# Patient Record
Sex: Male | Born: 1970 | Race: White | Hispanic: No | Smoking: Never smoker
Health system: Southern US, Community
[De-identification: ages and names within clinical notes are randomized; demographics above are authoritative.]

## PROBLEM LIST (undated history)

## (undated) ENCOUNTER — Emergency Department: Payer: Self-pay

## (undated) DIAGNOSIS — C9210 Chronic myeloid leukemia, BCR/ABL-positive, not having achieved remission: Secondary | ICD-10-CM

## (undated) DIAGNOSIS — D689 Coagulation defect, unspecified: Secondary | ICD-10-CM

## (undated) DIAGNOSIS — G8929 Other chronic pain: Secondary | ICD-10-CM

## (undated) DIAGNOSIS — C801 Malignant (primary) neoplasm, unspecified: Secondary | ICD-10-CM

## (undated) DIAGNOSIS — K7460 Unspecified cirrhosis of liver: Secondary | ICD-10-CM

## (undated) DIAGNOSIS — G9520 Unspecified cord compression: Secondary | ICD-10-CM

## (undated) DIAGNOSIS — E119 Type 2 diabetes mellitus without complications: Secondary | ICD-10-CM

## (undated) DIAGNOSIS — F1410 Cocaine abuse, uncomplicated: Secondary | ICD-10-CM

## (undated) DIAGNOSIS — I639 Cerebral infarction, unspecified: Secondary | ICD-10-CM

## (undated) DIAGNOSIS — T4145XA Adverse effect of unspecified anesthetic, initial encounter: Secondary | ICD-10-CM

## (undated) DIAGNOSIS — T8859XA Other complications of anesthesia, initial encounter: Secondary | ICD-10-CM

## (undated) HISTORY — PX: TONSILLECTOMY: SUR1361

## (undated) HISTORY — PX: CHOLECYSTECTOMY: SHX55

## (undated) HISTORY — PX: VASECTOMY: SHX75

---

## 2005-10-12 ENCOUNTER — Ambulatory Visit (HOSPITAL_COMMUNITY): Admission: RE | Admit: 2005-10-12 | Discharge: 2005-10-12 | Payer: Self-pay | Admitting: Neurosurgery

## 2005-10-18 ENCOUNTER — Emergency Department (HOSPITAL_COMMUNITY): Admission: EM | Admit: 2005-10-18 | Discharge: 2005-10-18 | Payer: Self-pay | Admitting: Emergency Medicine

## 2005-11-06 ENCOUNTER — Emergency Department (HOSPITAL_COMMUNITY): Admission: EM | Admit: 2005-11-06 | Discharge: 2005-11-07 | Payer: Self-pay | Admitting: Emergency Medicine

## 2005-12-07 ENCOUNTER — Emergency Department (HOSPITAL_COMMUNITY): Admission: EM | Admit: 2005-12-07 | Discharge: 2005-12-08 | Payer: Self-pay | Admitting: Emergency Medicine

## 2006-08-29 ENCOUNTER — Emergency Department (HOSPITAL_COMMUNITY): Admission: EM | Admit: 2006-08-29 | Discharge: 2006-08-29 | Payer: Self-pay | Admitting: Emergency Medicine

## 2007-08-13 ENCOUNTER — Emergency Department (HOSPITAL_COMMUNITY): Admission: EM | Admit: 2007-08-13 | Discharge: 2007-08-13 | Payer: Self-pay | Admitting: Emergency Medicine
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
## 2013-11-18 ENCOUNTER — Encounter (HOSPITAL_COMMUNITY): Payer: Self-pay | Admitting: Emergency Medicine

## 2013-11-18 ENCOUNTER — Emergency Department (HOSPITAL_COMMUNITY): Payer: Self-pay

## 2013-11-18 ENCOUNTER — Emergency Department (HOSPITAL_COMMUNITY)
Admission: EM | Admit: 2013-11-18 | Discharge: 2013-11-18 | Disposition: A | Discharge status: Home / Self Care | Payer: Self-pay | Attending: Emergency Medicine | Admitting: Emergency Medicine

## 2013-11-18 DIAGNOSIS — Z8739 Personal history of other diseases of the musculoskeletal system and connective tissue: Secondary | ICD-10-CM | POA: Insufficient documentation

## 2013-11-18 DIAGNOSIS — H538 Other visual disturbances: Secondary | ICD-10-CM | POA: Insufficient documentation

## 2013-11-18 DIAGNOSIS — R51 Headache: Secondary | ICD-10-CM | POA: Insufficient documentation

## 2013-11-18 DIAGNOSIS — Z8673 Personal history of transient ischemic attack (TIA), and cerebral infarction without residual deficits: Secondary | ICD-10-CM | POA: Insufficient documentation

## 2013-11-18 DIAGNOSIS — R209 Unspecified disturbances of skin sensation: Secondary | ICD-10-CM | POA: Insufficient documentation

## 2013-11-18 DIAGNOSIS — R202 Paresthesia of skin: Secondary | ICD-10-CM

## 2013-11-18 DIAGNOSIS — Z79899 Other long term (current) drug therapy: Secondary | ICD-10-CM | POA: Insufficient documentation

## 2013-11-18 HISTORY — DX: Unspecified cord compression: G95.20

## 2013-11-18 HISTORY — DX: Cerebral infarction, unspecified: I63.9

## 2013-11-18 LAB — CBC WITH DIFFERENTIAL/PLATELET
Basophils Absolute: 0.1 10*3/uL (ref 0.0–0.1)
Basophils Relative: 1 % (ref 0–1)
Hemoglobin: 14.8 g/dL (ref 13.0–17.0)
Lymphocytes Relative: 32 % (ref 12–46)
MCH: 31.1 pg (ref 26.0–34.0)
MCHC: 34.5 g/dL (ref 30.0–36.0)
MCV: 90.1 fL (ref 78.0–100.0)
Monocytes Absolute: 1 10*3/uL (ref 0.1–1.0)
Neutro Abs: 6 10*3/uL (ref 1.7–7.7)
Neutrophils Relative %: 56 % (ref 43–77)
Platelets: 242 10*3/uL (ref 150–400)
RDW: 12.9 % (ref 11.5–15.5)
WBC: 10.6 10*3/uL — ABNORMAL HIGH (ref 4.0–10.5)

## 2013-11-18 LAB — RAPID URINE DRUG SCREEN, HOSP PERFORMED
Amphetamines: NOT DETECTED
Barbiturates: NOT DETECTED
Benzodiazepines: POSITIVE — AB
Cocaine: NOT DETECTED
Opiates: POSITIVE — AB

## 2013-11-18 LAB — POCT I-STAT, CHEM 8
BUN: 7 mg/dL (ref 6–23)
Calcium, Ion: 1.26 mmol/L — ABNORMAL HIGH (ref 1.12–1.23)
Chloride: 102 mEq/L (ref 96–112)
Creatinine, Ser: 1.1 mg/dL (ref 0.50–1.35)
Glucose, Bld: 99 mg/dL (ref 70–99)
HCT: 46 % (ref 39.0–52.0)
Hemoglobin: 15.6 g/dL (ref 13.0–17.0)
TCO2: 28 mmol/L (ref 0–100)

## 2013-11-18 LAB — PROTIME-INR
INR: 1.14 (ref 0.00–1.49)
Prothrombin Time: 14.4 seconds (ref 11.6–15.2)

## 2013-11-18 MED ORDER — DIPHENHYDRAMINE HCL 50 MG/ML IJ SOLN
25.0000 mg | Freq: Once | INTRAMUSCULAR | Status: AC
  Administered 2013-11-18: 25 mg via INTRAVENOUS
  Filled 2013-11-18: qty 1

## 2013-11-18 MED ORDER — LORAZEPAM 2 MG/ML IJ SOLN
1.0000 mg | Freq: Once | INTRAMUSCULAR | Status: AC
  Administered 2013-11-18: 1 mg via INTRAVENOUS
  Filled 2013-11-18: qty 1

## 2013-11-18 MED ORDER — METOCLOPRAMIDE HCL 5 MG/ML IJ SOLN
10.0000 mg | Freq: Once | INTRAMUSCULAR | Status: AC
  Administered 2013-11-18: 10 mg via INTRAVENOUS
  Filled 2013-11-18: qty 2

## 2013-11-18 MED ORDER — LORAZEPAM 2 MG/ML IJ SOLN
INTRAMUSCULAR | Status: AC
  Filled 2013-11-18: qty 1

## 2013-11-18 MED ORDER — KETOROLAC TROMETHAMINE 30 MG/ML IJ SOLN
30.0000 mg | Freq: Once | INTRAMUSCULAR | Status: AC
  Administered 2013-11-18: 30 mg via INTRAVENOUS
  Filled 2013-11-18: qty 1

## 2013-11-18 MED ORDER — OXYCODONE HCL 5 MG PO TABS
30.0000 mg | ORAL_TABLET | Freq: Once | ORAL | Status: AC
  Administered 2013-11-18: 30 mg via ORAL
  Filled 2013-11-18: qty 6

## 2013-11-18 MED ORDER — DEXAMETHASONE SODIUM PHOSPHATE 10 MG/ML IJ SOLN
10.0000 mg | Freq: Once | INTRAMUSCULAR | Status: AC
  Administered 2013-11-18: 10 mg via INTRAVENOUS
  Filled 2013-11-18: qty 1

## 2013-11-18 NOTE — ED Provider Notes (Signed)
Medical screening examination/treatment/procedure(s) were conducted as a shared visit with non-physician practitioner(s) and myself.  I personally evaluated the patient during the encounter.  Pt with onset of right sided headache tonight around midnight with left upper and lower weakness.  Pt reports he had similar headache prior to previous strokes.  Initial stroke 10/2005, admitted to [facility].  Second stroke 05/2007 in [place].  No TPA.  Some cognitive problems post strokes.  Pt takes aspirin.  No current neurologist.  Pt on chronic pain medications for neck pain.  Olivia Mackie, MD 11/18/13 [phone]

## 2013-11-18 NOTE — ED Notes (Signed)
Patient transported to MRI 

## 2013-11-18 NOTE — ED Provider Notes (Signed)
Medical screening examination/treatment/procedure(s) were conducted as a shared visit with non-physician practitioner(s) and myself.  I personally evaluated the patient during the encounter.  EKG Interpretation   None        Olivia Mackie, MD 11/18/13 [phone]

## 2013-11-18 NOTE — ED Notes (Signed)
Pt unable to complete MRI due to anxiety. Pt received 2 mg total of ativan.
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
## 2013-11-18 NOTE — ED Provider Notes (Signed)
6:16 AM Patient signed out to me by Ivonne Andrew, PA-C. Patient is pending CT head and labs for headache work up. Patient was given a migraine cocktail for symptoms.   10:12 AM Patient's head CT unremarkable for acute changes. Patient reports still having a slight headache, however, it is improving. Patient was unable to tolerate the MRI. Patient was given 2mg  IV Ativan for the MRI and was still unable to tolerate the MRI. Patient also requested 30mg  Oxycodone IR, per his usual pain medications that he takes TID.   I reviewed the patient's records from 2006 [facility] from his previous MRI and MRA which showed no infarct or abnormality. Patient stated that he had a previous stroke, however, the records and previous MRI do not show any infarct when he was having the same symptoms in the past.  I feel comfortable that the patient is having intermittent left side neurologic symptoms without infarct as stated in the past. Patient will be discharged with a resource guide and instructed to follow up with a PCP. Vitals stable and patient afebrile.   Emilia Beck, PA-C 11/18/13 1018

## 2013-11-18 NOTE — ED Notes (Signed)
Per EMS, pt has HA that started behind right eye around midnight. Hx of stroke in 2006 and 2008.

## 2013-11-18 NOTE — ED Notes (Signed)
PA at bedside.

## 2015-02-12 ENCOUNTER — Inpatient Hospital Stay: Admit: 2015-02-12 | Discharge: 2015-02-12 | Attending: Emergency Medicine

## 2015-02-12 DIAGNOSIS — M542 Cervicalgia: Secondary | ICD-10-CM

## 2015-02-12 MED ORDER — OXYCODONE-ACETAMINOPHEN 10-325 MG PO TABS
10-325 MG | ORAL_TABLET | Freq: Four times a day (QID) | ORAL | Status: AC | PRN
Start: 2015-02-12 — End: 2015-02-19
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
## 2016-12-20 DIAGNOSIS — M50123 Cervical disc disorder at C6-C7 level with radiculopathy: Secondary | ICD-10-CM | POA: Insufficient documentation

## 2017-01-12 DIAGNOSIS — C9210 Chronic myeloid leukemia, BCR/ABL-positive, not having achieved remission: Secondary | ICD-10-CM | POA: Diagnosis present

## 2017-07-18 ENCOUNTER — Inpatient Hospital Stay (HOSPITAL_COMMUNITY): Payer: Medicaid Other

## 2017-07-18 ENCOUNTER — Other Ambulatory Visit: Payer: Self-pay

## 2017-07-18 ENCOUNTER — Encounter (HOSPITAL_COMMUNITY): Payer: Self-pay | Admitting: Emergency Medicine

## 2017-07-18 ENCOUNTER — Inpatient Hospital Stay (HOSPITAL_COMMUNITY)
Admission: EM | Admit: 2017-07-18 | Discharge: 2017-07-20 | DRG: 184 | Disposition: A | Discharge status: Home / Self Care | Payer: Medicaid Other | Attending: General Surgery | Admitting: General Surgery

## 2017-07-18 ENCOUNTER — Emergency Department (HOSPITAL_COMMUNITY): Payer: Medicaid Other

## 2017-07-18 ENCOUNTER — Other Ambulatory Visit (HOSPITAL_COMMUNITY): Payer: Self-pay

## 2017-07-18 DIAGNOSIS — Y92096 Garden or yard of other non-institutional residence as the place of occurrence of the external cause: Secondary | ICD-10-CM

## 2017-07-18 DIAGNOSIS — S0101XA Laceration without foreign body of scalp, initial encounter: Secondary | ICD-10-CM

## 2017-07-18 DIAGNOSIS — I10 Essential (primary) hypertension: Secondary | ICD-10-CM

## 2017-07-18 DIAGNOSIS — M545 Low back pain: Secondary | ICD-10-CM | POA: Diagnosis present

## 2017-07-18 DIAGNOSIS — S2249XD Multiple fractures of ribs, unspecified side, subsequent encounter for fracture with routine healing: Secondary | ICD-10-CM | POA: Diagnosis not present

## 2017-07-18 DIAGNOSIS — Z7901 Long term (current) use of anticoagulants: Secondary | ICD-10-CM | POA: Diagnosis not present

## 2017-07-18 DIAGNOSIS — D72829 Elevated white blood cell count, unspecified: Secondary | ICD-10-CM

## 2017-07-18 DIAGNOSIS — S270XXA Traumatic pneumothorax, initial encounter: Secondary | ICD-10-CM

## 2017-07-18 DIAGNOSIS — G894 Chronic pain syndrome: Secondary | ICD-10-CM | POA: Diagnosis present

## 2017-07-18 DIAGNOSIS — S2242XA Multiple fractures of ribs, left side, initial encounter for closed fracture: Secondary | ICD-10-CM | POA: Diagnosis present

## 2017-07-18 DIAGNOSIS — R079 Chest pain, unspecified: Secondary | ICD-10-CM | POA: Diagnosis present

## 2017-07-18 DIAGNOSIS — T1490XA Injury, unspecified, initial encounter: Secondary | ICD-10-CM

## 2017-07-18 DIAGNOSIS — W14XXXA Fall from tree, initial encounter: Secondary | ICD-10-CM

## 2017-07-18 DIAGNOSIS — Z806 Family history of leukemia: Secondary | ICD-10-CM | POA: Diagnosis not present

## 2017-07-18 DIAGNOSIS — Z8673 Personal history of transient ischemic attack (TIA), and cerebral infarction without residual deficits: Secondary | ICD-10-CM | POA: Diagnosis not present

## 2017-07-18 DIAGNOSIS — R739 Hyperglycemia, unspecified: Secondary | ICD-10-CM | POA: Diagnosis not present

## 2017-07-18 DIAGNOSIS — S42022A Displaced fracture of shaft of left clavicle, initial encounter for closed fracture: Secondary | ICD-10-CM

## 2017-07-18 DIAGNOSIS — W19XXXD Unspecified fall, subsequent encounter: Secondary | ICD-10-CM | POA: Diagnosis not present

## 2017-07-18 DIAGNOSIS — S2249XA Multiple fractures of ribs, unspecified side, initial encounter for closed fracture: Secondary | ICD-10-CM | POA: Diagnosis present

## 2017-07-18 DIAGNOSIS — S42002A Fracture of unspecified part of left clavicle, initial encounter for closed fracture: Secondary | ICD-10-CM

## 2017-07-18 DIAGNOSIS — D62 Acute posthemorrhagic anemia: Secondary | ICD-10-CM | POA: Diagnosis not present

## 2017-07-18 DIAGNOSIS — Z856 Personal history of leukemia: Secondary | ICD-10-CM | POA: Diagnosis not present

## 2017-07-18 DIAGNOSIS — J939 Pneumothorax, unspecified: Secondary | ICD-10-CM

## 2017-07-18 DIAGNOSIS — S42002D Fracture of unspecified part of left clavicle, subsequent encounter for fracture with routine healing: Secondary | ICD-10-CM | POA: Diagnosis not present

## 2017-07-18 DIAGNOSIS — W19XXXA Unspecified fall, initial encounter: Secondary | ICD-10-CM

## 2017-07-18 HISTORY — DX: Malignant (primary) neoplasm, unspecified: C80.1

## 2017-07-18 LAB — COMPREHENSIVE METABOLIC PANEL
ALT: 41 U/L (ref 17–63)
AST: 78 U/L — ABNORMAL HIGH (ref 15–41)
Albumin: 3.8 g/dL (ref 3.5–5.0)
Alkaline Phosphatase: 150 U/L — ABNORMAL HIGH (ref 38–126)
Anion gap: 10 (ref 5–15)
BUN: 9 mg/dL (ref 6–20)
CALCIUM: 9 mg/dL (ref 8.9–10.3)
CHLORIDE: 105 mmol/L (ref 101–111)
CO2: 23 mmol/L (ref 22–32)
CREATININE: 0.74 mg/dL (ref 0.61–1.24)
Glucose, Bld: 124 mg/dL — ABNORMAL HIGH (ref 65–99)
Potassium: 4 mmol/L (ref 3.5–5.1)
Sodium: 138 mmol/L (ref 135–145)
Total Bilirubin: 1.4 mg/dL — ABNORMAL HIGH (ref 0.3–1.2)
Total Protein: 7.3 g/dL (ref 6.5–8.1)

## 2017-07-18 LAB — CBC WITH DIFFERENTIAL/PLATELET
Basophils Absolute: 0 10*3/uL (ref 0.0–0.1)
Basophils Relative: 0 %
Eosinophils Absolute: 0 10*3/uL (ref 0.0–0.7)
Eosinophils Relative: 0 %
HCT: 41 % (ref 39.0–52.0)
HEMOGLOBIN: 13.8 g/dL (ref 13.0–17.0)
LYMPHS ABS: 1 10*3/uL (ref 0.7–4.0)
LYMPHS PCT: 8 %
MCH: 29.7 pg (ref 26.0–34.0)
MCHC: 33.7 g/dL (ref 30.0–36.0)
MCV: 88.4 fL (ref 78.0–100.0)
Monocytes Absolute: 1 10*3/uL (ref 0.1–1.0)
Monocytes Relative: 8 %
Neutro Abs: 10.6 10*3/uL — ABNORMAL HIGH (ref 1.7–7.7)
Neutrophils Relative %: 84 %
Platelets: 191 10*3/uL (ref 150–400)
RBC: 4.64 MIL/uL (ref 4.22–5.81)
RDW: 14.1 % (ref 11.5–15.5)
WBC: 12.6 10*3/uL — ABNORMAL HIGH (ref 4.0–10.5)

## 2017-07-18 LAB — CK: Total CK: 1660 U/L — ABNORMAL HIGH (ref 49–397)

## 2017-07-18 LAB — LIPASE, BLOOD: LIPASE: 24 U/L (ref 11–51)

## 2017-07-18 LAB — ETHANOL: Alcohol, Ethyl (B): <5 mg/dL (ref <5)

## 2017-07-18 MED ORDER — HYDROCHLOROTHIAZIDE 25 MG PO TABS
12.5000 mg | ORAL_TABLET | Freq: Every day | ORAL | Status: DC
  Administered 2017-07-19 – 2017-07-20 (×2): 12.5 mg via ORAL
  Filled 2017-07-18 (×2): qty 1

## 2017-07-18 MED ORDER — IOPAMIDOL (ISOVUE-300) INJECTION 61%
INTRAVENOUS | Status: AC
Start: 2017-07-18 — End: 2017-07-18
  Administered 2017-07-18: 100 mL
  Filled 2017-07-18: qty 100

## 2017-07-18 MED ORDER — HYDROMORPHONE HCL 1 MG/ML IJ SOLN
1.0000 mg | INTRAMUSCULAR | Status: DC | PRN
  Administered 2017-07-18 – 2017-07-20 (×14): 2 mg via INTRAVENOUS
  Filled 2017-07-18 (×14): qty 2

## 2017-07-18 MED ORDER — ONDANSETRON HCL 4 MG/2ML IJ SOLN
4.0000 mg | Freq: Once | INTRAMUSCULAR | Status: DC
  Filled 2017-07-18: qty 2

## 2017-07-18 MED ORDER — METHOCARBAMOL 750 MG PO TABS
750.0000 mg | ORAL_TABLET | Freq: Three times a day (TID) | ORAL | Status: DC
  Administered 2017-07-18 – 2017-07-20 (×6): 750 mg via ORAL
  Filled 2017-07-18 (×3): qty 1
  Filled 2017-07-18: qty 2
  Filled 2017-07-18 (×2): qty 1

## 2017-07-18 MED ORDER — DULOXETINE HCL 60 MG PO CPEP: 60.0000 mg | ORAL_CAPSULE | Freq: Every day | ORAL | Status: DC

## 2017-07-18 MED ORDER — ZOLPIDEM TARTRATE 5 MG PO TABS
10.0000 mg | ORAL_TABLET | Freq: Every evening | ORAL | Status: DC | PRN
  Administered 2017-07-18 – 2017-07-19 (×2): 10 mg via ORAL
  Filled 2017-07-18 (×2): qty 2

## 2017-07-18 MED ORDER — DOCUSATE SODIUM 100 MG PO CAPS
100.0000 mg | ORAL_CAPSULE | Freq: Two times a day (BID) | ORAL | Status: DC
  Administered 2017-07-18 – 2017-07-20 (×4): 100 mg via ORAL
  Filled 2017-07-18 (×4): qty 1

## 2017-07-18 MED ORDER — OXYCODONE HCL 5 MG PO TABS
10.0000 mg | ORAL_TABLET | Freq: Three times a day (TID) | ORAL | Status: DC
  Administered 2017-07-18 – 2017-07-19 (×2): 10 mg via ORAL
  Filled 2017-07-18 (×3): qty 2

## 2017-07-18 MED ORDER — LOSARTAN POTASSIUM 50 MG PO TABS
50.0000 mg | ORAL_TABLET | Freq: Every day | ORAL | Status: DC
  Administered 2017-07-18 – 2017-07-20 (×3): 50 mg via ORAL
  Filled 2017-07-18 (×3): qty 1

## 2017-07-18 MED ORDER — POTASSIUM CHLORIDE IN NACL 20-0.45 MEQ/L-% IV SOLN
INTRAVENOUS | Status: DC
Start: 2017-07-18 — End: 2017-07-20
  Administered 2017-07-18 – 2017-07-19 (×2): via INTRAVENOUS
  Filled 2017-07-18 (×3): qty 1000

## 2017-07-18 MED ORDER — CEFAZOLIN SODIUM-DEXTROSE 1-4 GM/50ML-% IV SOLN
1.0000 g | Freq: Three times a day (TID) | INTRAVENOUS | Status: AC
  Administered 2017-07-18 – 2017-07-19 (×4): 1 g via INTRAVENOUS
  Filled 2017-07-18 (×4): qty 50

## 2017-07-18 MED ORDER — OXYCODONE HCL 10 MG PO TABS: 10.0000 mg | ORAL_TABLET | Freq: Three times a day (TID) | ORAL | Status: DC

## 2017-07-18 MED ORDER — ACETAMINOPHEN 500 MG PO TABS
1000.0000 mg | ORAL_TABLET | Freq: Three times a day (TID) | ORAL | Status: DC
  Administered 2017-07-18 – 2017-07-20 (×6): 1000 mg via ORAL
  Filled 2017-07-18 (×4): qty 2

## 2017-07-18 MED ORDER — HYDROMORPHONE HCL 1 MG/ML IJ SOLN
1.0000 mg | Freq: Once | INTRAMUSCULAR | Status: AC
  Administered 2017-07-18: 1 mg via INTRAVENOUS
  Filled 2017-07-18: qty 1

## 2017-07-18 MED ORDER — OLMESARTAN MEDOXOMIL-HCTZ 20-12.5 MG PO TABS: 1.0000 | ORAL_TABLET | Freq: Every day | ORAL | Status: DC

## 2017-07-18 MED ORDER — PANTOPRAZOLE SODIUM 40 MG PO TBEC
40.0000 mg | DELAYED_RELEASE_TABLET | Freq: Every day | ORAL | Status: DC
  Administered 2017-07-18 – 2017-07-20 (×3): 40 mg via ORAL
  Filled 2017-07-18 (×3): qty 1

## 2017-07-18 MED ORDER — BISACODYL 10 MG RE SUPP: 10.0000 mg | Freq: Every day | RECTAL | Status: DC | PRN

## 2017-07-18 MED ORDER — LIDOCAINE HCL (PF) 1 % IJ SOLN
INTRAMUSCULAR | Status: AC
  Filled 2017-07-18: qty 30

## 2017-07-18 MED ORDER — ENOXAPARIN SODIUM 30 MG/0.3ML ~~LOC~~ SOLN
30.0000 mg | Freq: Two times a day (BID) | SUBCUTANEOUS | Status: DC
  Administered 2017-07-18 – 2017-07-20 (×4): 30 mg via SUBCUTANEOUS
  Filled 2017-07-18 (×4): qty 0.3

## 2017-07-18 MED ORDER — AMLODIPINE BESYLATE 5 MG PO TABS
5.0000 mg | ORAL_TABLET | Freq: Every day | ORAL | Status: DC
  Administered 2017-07-18 – 2017-07-20 (×3): 5 mg via ORAL
  Filled 2017-07-18 (×3): qty 1

## 2017-07-18 MED ORDER — CLOMIPHENE CITRATE 50 MG PO TABS: 25.0000 mg | ORAL_TABLET | ORAL | Status: DC

## 2017-07-18 MED ORDER — LIDOCAINE-EPINEPHRINE 1 %-1:100000 IJ SOLN
10.0000 mL | Freq: Once | INTRAMUSCULAR | Status: AC
Start: 2017-07-18 — End: 2017-07-18
  Administered 2017-07-18: 10 mL via INTRADERMAL
  Filled 2017-07-18: qty 10

## 2017-07-18 MED ORDER — ASPIRIN 325 MG PO TABS
325.0000 mg | ORAL_TABLET | Freq: Every day | ORAL | Status: DC
  Administered 2017-07-18 – 2017-07-20 (×3): 325 mg via ORAL
  Filled 2017-07-18 (×3): qty 1

## 2017-07-18 MED ORDER — PANTOPRAZOLE SODIUM 40 MG IV SOLR: 40.0000 mg | Freq: Every day | INTRAVENOUS | Status: DC

## 2017-07-18 NOTE — ED Provider Notes (Addendum)
[facility] DEPT Provider Note   CSN: [id number] Arrival date & time: 07/18/17  1709     History   Chief Complaint Chief Complaint  Patient presents with  . Shoulder Pain  . Head Injury    HPI Cody Nunez is a 46 y.o. male.  HPI   46 year old male with PMHx chronic pain here with diffuse pain. Pt reportedly went outside into his backyard last night, estimated 12-18 hours ago, to look for deer. He reports that he remembers nothing until returning home, at which time he felt "something weird" in his head, felt pain all over, and went to sleep. He awoke today with diffuse pain. He states that his neighbors stated that they heard a large "bang" in the woods and believe that a tree fell. Currently the pt c/o 10 out of 10 pain in his head, left shoulder and left arm, chest, abdomen, and bilateral knees. He also has pain to his right thumb. Pain worse with any movement. He denies any drug or alcohol use. Not on blood thinners. Tetanus was recently updated. 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   Past Medical History:  Diagnosis Date  . Cancer ([facility])   . 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 Stroke [facility])    2006, 2008    Patient Active Problem List   Diagnosis Date Noted  . Multiple rib fractures 07/18/2017    Past Surgical History:  Procedure Laterality Date  . CHOLECYSTECTOMY     2005  . TONSILLECTOMY     1988  . VASECTOMY         Home Medications    Prior to Admission medications   Medication Sig Start Date End Date Taking? Authorizing Provider  amLODipine (NORVASC) 5 MG tablet Take 5 mg by mouth daily. 02/09/16  Yes [provider]  aspirin (GOODSENSE ASPIRIN) 325 MG tablet Take 325 mg by mouth daily.   Yes [provider]  clomiPHENE (CLOMID) 50 MG tablet Take 25 mg by mouth daily as needed.    Yes [provider]  hydrochlorothiazide (HYDRODIURIL) 12.5 MG tablet Take 12.5 mg by mouth daily. 02/15/16  Yes [provider]  losartan (COZAAR) 50 MG tablet Take 50 mg by mouth daily. 02/15/16  Yes [provider]  olmesartan-hydrochlorothiazide (BENICAR HCT) 20-12.5 MG per tablet Take 1 tablet by mouth daily.   Yes [provider]  Oxycodone HCl 10 MG TABS Take 10 mg by mouth 3 (three) times daily. 04/24/17  Yes [provider]  vardenafil (LEVITRA) 20 MG tablet Take 10 mg by mouth daily as needed for erectile dysfunction.   Yes [provider]  zolpidem (AMBIEN) 10 MG tablet Take 10 mg by mouth at bedtime as needed for sleep. 07/05/17  Yes [provider]    Family History No family history on file.  Social History Social History  Substance Use Topics  . Smoking status: Never Smoker  . Smokeless tobacco: Not on file  . Alcohol use No     Allergies   Patient has no known allergies.   Review of Systems Review of Systems  Constitutional: Positive for fatigue.  Musculoskeletal: Positive for arthralgias and myalgias.  Skin: Positive for wound.  All other systems reviewed and are negative.    Physical Exam Updated Vital Signs BP (!) 142/76 (BP Location: Right Arm)   Pulse 82   Temp 98.6 F (37 C) (Oral)   Resp  20   Ht 5\' 8"  (1.727 m)   Wt 116.7 kg (257 lb 4.4 oz)   SpO2 98%   BMI 39.12 kg/m   Physical Exam  Constitutional: He is oriented to person, place, and time. He appears well-developed and well-nourished. No distress.  HENT:  Head: Normocephalic and atraumatic.  Approximately 12 cm deep curvilinear laceration to the scalp. No obvious contamination or visible organic foreign matter.  Eyes: Pupils are equal, round, and reactive to light. Conjunctivae are normal.  Neck: Neck supple.  Cervical collar is in place. Trachea midline  Cardiovascular: Normal rate, regular rhythm and normal heart sounds.  Exam reveals no friction rub.   No murmur heard. Pulmonary/Chest: Effort normal and breath sounds normal. No respiratory distress. He  has no wheezes. He has no rales.  Exquisite tenderness over right and left anterior chest wall. There is bruising to the right breast and left chest. Significant bruising of her left clavicle as well as proximal humerus.  Abdominal: Soft. He exhibits no distension. There is tenderness. There is no guarding.  Musculoskeletal: He exhibits tenderness (Tenderness over anterior chest, left shoulder, left upper arm, and right thenar eminence with bruising. No open wounds.). He exhibits no edema.  Neurological: He is alert and oriented to person, place, and time. He exhibits normal muscle tone.  Skin: Skin is warm. Capillary refill takes less than 2 seconds.  Scattered abrasions  Psychiatric: He has a normal mood and affect.  Nursing note and vitals reviewed.    ED Treatments / Results  Labs (all labs ordered are listed, but only abnormal results are displayed) Labs Reviewed  CBC WITH DIFFERENTIAL/PLATELET - Abnormal; Notable for the following:       Result Value   WBC 12.6 (*)    Neutro Abs 10.6 (*)    All other components within normal limits  CK - Abnormal; Notable for the following:    Total CK 1,660 (*)    All other components within normal limits  COMPREHENSIVE METABOLIC PANEL - Abnormal; Notable for the following:    Glucose, Bld 124 (*)    AST 78 (*)    Alkaline Phosphatase 150 (*)    Total Bilirubin 1.4 (*)    All other components within normal limits  ETHANOL  LIPASE, BLOOD  HIV ANTIBODY (ROUTINE TESTING)  CBC  BASIC METABOLIC PANEL    EKG  EKG Interpretation None       Radiology Dg Pelvis 1-2 Views  Result Date: 07/18/2017 CLINICAL DATA:  Fall from tree stand EXAM: PELVIS - 1-2 VIEW COMPARISON:  None. FINDINGS: No acute fracture. No dislocation.  Unremarkable soft tissues. IMPRESSION: No acute bony pathology. Electronically Signed   By: Marybelle Killings M.D.   On: 07/18/2017 17:57   Ct Head Wo Contrast  Result Date: 07/18/2017 CLINICAL DATA:  Trauma EXAM: CT HEAD  WITHOUT CONTRAST CT CERVICAL SPINE WITHOUT CONTRAST TECHNIQUE: Multidetector CT imaging of the head and cervical spine was performed following the standard protocol without intravenous contrast. Multiplanar CT image reconstructions of the cervical spine were also generated. COMPARISON:  03/06/2017 FINDINGS: CT HEAD FINDINGS Brain: There is no mass effect, midline shift, or acute intracranial hemorrhage. Brain parenchyma and ventricular system are unremarkable. Vascular: No hyperdense vessel or unexpected calcification. Skull: Cranium is intact. Sinuses/Orbits: Mastoid air cells and visualized paranasal sinuses are clear. Other: Soft tissue injury over the vertex is noted. CT CERVICAL SPINE FINDINGS Alignment: Anatomic Skull base and vertebrae: No acute fracture or dislocation. Anterior plate and screws  are present at C5 and C6 for interbody fusion. There is solid fusion across the disc space. No breakage or loosening of the hardware. Soft tissues and spinal canal: No obvious soft tissue hematoma. No obvious spinal hematoma. Disc levels:  No obvious disc herniation or spinal stenosis. Upper chest: Small left pneumothorax. Other: Noncontributory. IMPRESSION: No acute intracranial pathology. No evidence of cervical spine injury. C5-6 anterior interbody fusion is intact. Left pneumothorax. Electronically Signed   By: Marybelle Killings M.D.   On: 07/18/2017 18:56   Ct Chest W Contrast  Result Date: 07/18/2017 CLINICAL DATA:  Trauma with evidence of small left pneumothorax and left-sided rib and clavicle fractures by chest x-ray. EXAM: CT CHEST, ABDOMEN, AND PELVIS WITH CONTRAST TECHNIQUE: Multidetector CT imaging of the chest, abdomen and pelvis was performed following the standard protocol during bolus administration of intravenous contrast. CONTRAST:  189mL ISOVUE-300 IOPAMIDOL (ISOVUE-300) INJECTION 61% COMPARISON:  Chest x-ray earlier today. FINDINGS: CT CHEST FINDINGS Cardiovascular: No evidence of mediastinal  hemorrhage. The thoracic aorta appears normal. The heart size is normal. No pericardial fluid. Mediastinum/Nodes: No enlarged mediastinal, hilar, or axillary lymph nodes. Thyroid gland, trachea, and esophagus demonstrate no significant findings. Lungs/Pleura: Anterior pneumothorax on the left of approximately 10% volume. There is atelectasis in the left lower lung. Musculoskeletal: Nondisplaced medial left first rib fracture. Mildly displaced lateral left fourth rib fracture. Nondisplaced lateral left fifth and sixth rib fractures. Subcutaneous hemorrhage noted in the medial left upper pectoral region. The left clavicle is incompletely imaged in the left clavicular fracture seen by chest and shoulder x-ray is not assessed. CT ABDOMEN PELVIS FINDINGS Hepatobiliary: No hepatic injury or perihepatic hematoma. Status post cholecystectomy. No biliary ductal dilatation identified. Pancreas: Unremarkable. No pancreatic ductal dilatation or surrounding inflammatory changes. Spleen: No splenic injury or perisplenic hematoma. Adrenals/Urinary Tract: No adrenal hemorrhage or renal injury identified. Bladder is unremarkable. Stomach/Bowel: Bowel is unremarkable and shows no evidence of dilatation or thickening. No free air or free fluid. No abscess. Vascular/Lymphatic: No significant vascular findings are present. No enlarged abdominal or pelvic lymph nodes. Reproductive: Prostate is unremarkable. Other: No abdominal wall hernia or abnormality. No abdominopelvic ascites. Musculoskeletal: No acute or significant osseous findings. IMPRESSION: 1. Left pneumothorax of approximately 10% volume with associated left lower lung atelectasis. Nondisplaced left first, fifth and sixth rib fractures. Mildly displaced left fourth rib fracture. 2. Some subcutaneous chest wall hemorrhage is noted in the upper left medial pectoral region. 3. No acute intra-abdominal or pelvic injuries. Electronically Signed   By: Aletta Edouard M.D.   On:  07/18/2017 19:07   Ct Cervical Spine Wo Contrast  Result Date: 07/18/2017 CLINICAL DATA:  Trauma EXAM: CT HEAD WITHOUT CONTRAST CT CERVICAL SPINE WITHOUT CONTRAST TECHNIQUE: Multidetector CT imaging of the head and cervical spine was performed following the standard protocol without intravenous contrast. Multiplanar CT image reconstructions of the cervical spine were also generated. COMPARISON:  03/06/2017 FINDINGS: CT HEAD FINDINGS Brain: There is no mass effect, midline shift, or acute intracranial hemorrhage. Brain parenchyma and ventricular system are unremarkable. Vascular: No hyperdense vessel or unexpected calcification. Skull: Cranium is intact. Sinuses/Orbits: Mastoid air cells and visualized paranasal sinuses are clear. Other: Soft tissue injury over the vertex is noted. CT CERVICAL SPINE FINDINGS Alignment: Anatomic Skull base and vertebrae: No acute fracture or dislocation. Anterior plate and screws are present at C5 and C6 for interbody fusion. There is solid fusion across the disc space. No breakage or loosening of the hardware. Soft tissues and spinal canal:  No obvious soft tissue hematoma. No obvious spinal hematoma. Disc levels:  No obvious disc herniation or spinal stenosis. Upper chest: Small left pneumothorax. Other: Noncontributory. IMPRESSION: No acute intracranial pathology. No evidence of cervical spine injury. C5-6 anterior interbody fusion is intact. Left pneumothorax. Electronically Signed   By: Marybelle Killings M.D.   On: 07/18/2017 18:56   Ct Abdomen Pelvis W Contrast  Result Date: 07/18/2017 CLINICAL DATA:  Trauma with evidence of small left pneumothorax and left-sided rib and clavicle fractures by chest x-ray. EXAM: CT CHEST, ABDOMEN, AND PELVIS WITH CONTRAST TECHNIQUE: Multidetector CT imaging of the chest, abdomen and pelvis was performed following the standard protocol during bolus administration of intravenous contrast. CONTRAST:  159mL ISOVUE-300 IOPAMIDOL (ISOVUE-300) INJECTION  61% COMPARISON:  Chest x-ray earlier today. FINDINGS: CT CHEST FINDINGS Cardiovascular: No evidence of mediastinal hemorrhage. The thoracic aorta appears normal. The heart size is normal. No pericardial fluid. Mediastinum/Nodes: No enlarged mediastinal, hilar, or axillary lymph nodes. Thyroid gland, trachea, and esophagus demonstrate no significant findings. Lungs/Pleura: Anterior pneumothorax on the left of approximately 10% volume. There is atelectasis in the left lower lung. Musculoskeletal: Nondisplaced medial left first rib fracture. Mildly displaced lateral left fourth rib fracture. Nondisplaced lateral left fifth and sixth rib fractures. Subcutaneous hemorrhage noted in the medial left upper pectoral region. The left clavicle is incompletely imaged in the left clavicular fracture seen by chest and shoulder x-ray is not assessed. CT ABDOMEN PELVIS FINDINGS Hepatobiliary: No hepatic injury or perihepatic hematoma. Status post cholecystectomy. No biliary ductal dilatation identified. Pancreas: Unremarkable. No pancreatic ductal dilatation or surrounding inflammatory changes. Spleen: No splenic injury or perisplenic hematoma. Adrenals/Urinary Tract: No adrenal hemorrhage or renal injury identified. Bladder is unremarkable. Stomach/Bowel: Bowel is unremarkable and shows no evidence of dilatation or thickening. No free air or free fluid. No abscess. Vascular/Lymphatic: No significant vascular findings are present. No enlarged abdominal or pelvic lymph nodes. Reproductive: Prostate is unremarkable. Other: No abdominal wall hernia or abnormality. No abdominopelvic ascites. Musculoskeletal: No acute or significant osseous findings. IMPRESSION: 1. Left pneumothorax of approximately 10% volume with associated left lower lung atelectasis. Nondisplaced left first, fifth and sixth rib fractures. Mildly displaced left fourth rib fracture. 2. Some subcutaneous chest wall hemorrhage is noted in the upper left medial pectoral  region. 3. No acute intra-abdominal or pelvic injuries. Electronically Signed   By: Aletta Edouard M.D.   On: 07/18/2017 19:07   Dg Chest Portable 1 View  Result Date: 07/18/2017 CLINICAL DATA:  Struck by tree EXAM: PORTABLE CHEST 1 VIEW COMPARISON:  03/25/2015 FINDINGS: Upper normal heart size. Low volumes. Bibasilar atelectasis. Aortic knob is indistinct. Left apical pleural thickening. Acute fractures of the left first and fourth ribs. Left clavicle fracture with displacement. Tiny left apical pneumothorax. IMPRESSION: Bibasilar atelectasis. Aortic knob is indistinct.  Mediastinal hemorrhage not excluded. Tiny left pneumothorax. Left first and fourth rib fractures. Left apical pleural thickening. Electronically Signed   By: Marybelle Killings M.D.   On: 07/18/2017 19:05   Dg Cerv Spine Flex&ext Only  Result Date: 07/18/2017 CLINICAL DATA:  Pt. [place] out [place] stand tonight. No neck pain.Left sided rib and clavicle fractures and pneumothoraxPt. Unable to relax shoulders down due to fractures and chest pain EXAM: CERVICAL SPINE - FLEXION AND EXTENSION VIEWS ONLY COMPARISON:  Current cervical CT. FINDINGS: The superimposed shoulders obscures the the lower cervical spine, from C6 inferiorly. There is no fracture. There is no spondylolisthesis. The visualized anterior fusion plate at F6-E3 appears well-seated. There is  no subluxation with flexion or extension, and no movement of the orthopedic hardware. Soft tissues are unremarkable. IMPRESSION: 1. No fracture. No subluxation with flexion or extension. Lower cervical spine not visualized due to overlying soft tissues. Electronically Signed   By: Lajean Manes M.D.   On: 07/18/2017 21:07   Dg Shoulder Left  Result Date: 07/18/2017 CLINICAL DATA:  Fall from tree EXAM: LEFT SHOULDER - 2+ VIEW COMPARISON:  None. FINDINGS: There is a displaced fracture of the midclavicle. Multiple lateral left mid and upper rib fractures are suspected. Humerus is intact. Glenohumeral  joint is anatomic. IMPRESSION: Possible left rib fractures. Mid clavicle fracture. Electronically Signed   By: Marybelle Killings M.D.   On: 07/18/2017 17:58   Dg Knee Complete 4 Views Left  Result Date: 07/18/2017 CLINICAL DATA:  Fall from tree stand with multiple injuries. Initial encounter. EXAM: LEFT KNEE - COMPLETE 4+ VIEW COMPARISON:  None. FINDINGS: No evidence of fracture, dislocation, or joint effusion. There is some potential soft tissue thickening involving the distal aspect of the quadriceps tendon just superior to the patella. Correlation suggested with any clinical evidence of tendon injury. No soft tissue foreign body. IMPRESSION: No evidence of fracture. Potential thickening/injury of the distal quadriceps tendon. Electronically Signed   By: Aletta Edouard M.D.   On: 07/18/2017 18:00   Dg Knee Complete 4 Views Right  Result Date: 07/18/2017 CLINICAL DATA:  Fall from a tree stand today. Pt is unsure if he lost consciousness or if the tree stand broke and fell. Pt c/o all over left shoulder pain and has no ROM in the left shoulder. EXAM: RIGHT KNEE - COMPLETE 4+ VIEW COMPARISON:  None. FINDINGS: There is no acute fracture or subluxation. No joint effusion. Along the posterior distal aspect of the femur, there is focal portable thickening, favored to represent a remote fibrous cortical defect. IMPRESSION: 1.  No evidence for acute  abnormality. 2. Probably benign fibrous cortical defect along the distal posterior aspect of the femur. In the absence of known malignancy, no additional evaluation is needed. Electronically Signed   By: Nolon Nations M.D.   On: 07/18/2017 18:07   Dg Humerus Left  Result Date: 07/18/2017 CLINICAL DATA:  Fall from tree EXAM: LEFT HUMERUS - 2+ VIEW COMPARISON:  None. FINDINGS: No acute fracture. No dislocation.  Unremarkable soft tissues. IMPRESSION: No acute bony pathology. Electronically Signed   By: Marybelle Killings M.D.   On: 07/18/2017 17:58   Dg Hand Complete  Right  Result Date: 07/18/2017 CLINICAL DATA:  Trauma.  Fall. EXAM: RIGHT HAND - COMPLETE 3+ VIEW COMPARISON:  None. FINDINGS: No acute fracture. No acute dislocation. Unremarkable soft tissues. IMPRESSION: No acute bony pathology. Electronically Signed   By: Marybelle Killings M.D.   On: 07/18/2017 18:03    Procedures .Critical Care Performed by: Duffy Bruce Authorized by: Duffy Bruce   .Marland KitchenLaceration Repair Date/Time: 07/19/2017 12:18 AM Performed by: Duffy Bruce Authorized by: Duffy Bruce   Consent:    Consent obtained:  Verbal   Consent given by:  Patient   Risks discussed:  Infection, need for additional repair, nerve damage, pain, poor cosmetic result, poor wound healing, vascular damage, tendon damage and retained foreign body   Alternatives discussed:  Observation and referral Anesthesia (see MAR for exact dosages):    Anesthesia method:  Local infiltration   Local anesthetic:  Lidocaine 1% WITH epi Laceration details:    Location:  Scalp   Scalp location:  Mid-scalp   Length (cm):  13   Depth (mm):  5 Repair type:    Repair type:  Intermediate Pre-procedure details:    Preparation:  Patient was prepped and draped in usual sterile fashion and imaging obtained to evaluate for foreign bodies Exploration:    Hemostasis achieved with:  Direct pressure Treatment:    Area cleansed with:  Betadine   Amount of cleaning:  Extensive   Irrigation solution:  Sterile saline   Irrigation volume:  1000   Irrigation method:  Pressure wash Skin repair:    Repair method:  Staples   Number of staples:  14 Post-procedure details:    Dressing:  Antibiotic ointment   Patient tolerance of procedure:  Tolerated well, no immediate complications   (including critical care time)  CRITICAL CARE Performed by: Evonnie Pat   Total critical care time: 35 minutes  Critical care time was exclusive of separately billable procedures and treating other patients.  Critical care was  necessary to treat or prevent imminent or life-threatening deterioration.  Critical care was time spent personally by me on the following activities: development of treatment plan with patient and/or surrogate as well as nursing, discussions with consultants, evaluation of patient's response to treatment, examination of patient, obtaining history from patient or surrogate, ordering and performing treatments and interventions, ordering and review of laboratory studies, ordering and review of radiographic studies, pulse oximetry and re-evaluation of patient's condition.    Medications Ordered in ED Medications  ondansetron (ZOFRAN) injection 4 mg (4 mg Intravenous Refused 07/18/17 1905)  amLODipine (NORVASC) tablet 5 mg (5 mg Oral Given 07/18/17 2320)  aspirin tablet 325 mg (325 mg Oral Given 07/18/17 2318)  hydrochlorothiazide (HYDRODIURIL) tablet 12.5 mg (not administered)  losartan (COZAAR) tablet 50 mg (50 mg Oral Given 07/18/17 2318)  zolpidem (AMBIEN) tablet 10 mg (10 mg Oral Given 07/18/17 2259)  enoxaparin (LOVENOX) injection 30 mg (30 mg Subcutaneous Given 07/18/17 2322)  0.45 % NaCl with KCl 20 mEq / L infusion ( Intravenous New Bag/Given 07/18/17 2349)  ceFAZolin (ANCEF) IVPB 1 g/50 mL premix (0 g Intravenous Stopped 07/18/17 2209)  HYDROmorphone (DILAUDID) injection 1-2 mg (2 mg Intravenous Given 07/19/17 0144)  acetaminophen (TYLENOL) tablet 1,000 mg (1,000 mg Oral Given 07/18/17 2138)  pantoprazole (PROTONIX) EC tablet 40 mg (40 mg Oral Given 07/18/17 2320)    Or  pantoprazole (PROTONIX) injection 40 mg ( Intravenous See Alternative 07/18/17 2320)  bisacodyl (DULCOLAX) suppository 10 mg (not administered)  docusate sodium (COLACE) capsule 100 mg (100 mg Oral Given 07/18/17 2138)  methocarbamol (ROBAXIN) tablet 750 mg (750 mg Oral Given 07/18/17 2136)  lidocaine (PF) (XYLOCAINE) 1 % injection (not administered)  oxyCODONE (Oxy IR/ROXICODONE) immediate release tablet 10 mg (10 mg Oral Given 07/18/17 2319)    HYDROmorphone (DILAUDID) injection 1 mg (1 mg Intravenous Given 07/18/17 1902)  lidocaine-EPINEPHrine (XYLOCAINE W/EPI) 1 %-1:100000 (with pres) injection 10 mL (10 mLs Intradermal Given 07/18/17 1905)  iopamidol (ISOVUE-300) 61 % injection (100 mLs  Contrast Given 07/18/17 1807)     Initial Impression / Assessment and Plan / ED Course  I have reviewed the triage vital signs and the nursing notes.  Pertinent labs & imaging results that were available during my care of the patient were reviewed by me and considered in my medical decision making (see chart for details).    46 year old male here with multiple injuries after likely falling down a tree stand yesterday. Patient has a large, open scalp laceration, multiple left-sided rib fractures with pneumothorax, and diffuse bruising  and abrasions. Tetanus status is up-to-date. Dr. Hulen Skains of trauma surgery has been consulted and will admit the patient. He is satting well on room air so no chest tube placed at this time. Laceration was repaired by myself. Patient given Ancef and may benefit from outpt antibiotics given wound open >12 hours. Will need to be monitored. Otherwise, there is no apparent skull exposure or involvement of the galea. The wound was thoroughly cleansed and irrigated/explored.  Pt maintained in cervical collar in ED. Flex/Ex films ordered by trauma. Suspect his neck pain is 2/2 chronic pain, no signs of new ligamentous injury. EMS collar noted to be ill fitting - will defer to trauma re: philly or aspen collar placement.  Of note, I attempted to page Orthopedics regarding the left clavicle fx but due to issues with [facility] downtime/obtaining imaging, I was unable to reach them. Pt to be admitted to Trauma - will place in sling for now. No skin tenting, no NV compromise.  Final Clinical Impressions(s) / ED Diagnoses   Final diagnoses:  Trauma  Laceration of scalp, initial encounter  Traumatic pneumothorax, initial encounter  Displaced  fracture of shaft of left clavicle, initial encounter for closed fracture    New Prescriptions Current Discharge Medication List       Duffy Bruce, MD 07/19/17 8403    Duffy Bruce, MD 07/19/17 Hal Morales    Duffy Bruce, MD 07/19/17 [phone]

## 2017-07-18 NOTE — ED Triage Notes (Signed)
Pt arrives from home via EMS reporting unknown source of trauma with LOC and memory loss. Pt c/o L shoulder pain, head pain with lac, LUE pain.  Pt reports walking into woods yesterday late afternoon and waking today naked with injuries in house.  EMS reports pt was seen walking naked out of woods at 0600.  Pt AOx4 on arrival.

## 2017-07-18 NOTE — ED Notes (Signed)
Patient transported to CT 

## 2017-07-18 NOTE — H&P (Signed)
History   Cody Nunez is an 46 y.o. Nunez.   Chief Complaint:  Chief Complaint  Patient presents with  . Shoulder Pain  . Head Injury    Cody Nunez, questionable history of some type of event that happened somewhere between 9:00 PM yesterday evening and 11:00 AM this morning when he walked out of the woods near a deer stand and was found to be battered, bruised and cut from whatever had happened.  Initially it was thought that he had been hit by a fallen tree, but the last that the patient recalls he had been up in a deer stand, and that is all he remembers.  He has an interesting history of CML and chronic pain syndrome from multiple neck and back problems, some are injury related.  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  In the notes from [facility], though, it looks like [name] given to him by someone in his previous practice or program.  I can find no notes of formal treatment.  I believe that the patient probably fell out of the deer stand.  He has an obvious mid scalp 10-15cm laceration that is > 16 hours open, but not grossly contaminated.  It is not bleeding.   Trauma Mechanism of injury: fall Injury location: head/neck, shoulder/arm and torso Injury location detail: head and scalp, L arm and L shoulder and L chest Incident location: woods Time since incident: 20 hours Arrived directly from scene: yes   Fall:      Fall occurred: probably from a deer stand.      Height of fall: unknown      Impact surface: grass and dirt      Point of impact: head  Protective equipment:       None      Suspicion of alcohol use: yes      Suspicion of drug use: yes  EMS/PTA data:      Ambulatory at scene: yes      Blood loss: minimal      Responsiveness: alert  Oriented to: person, place and time      Loss of consciousness: yes      Loss of consciousness duration: 12 hours      Amnesic to event: yes      Airway interventions: none      Breathing interventions: none      IV access: established      IO access: none      Fluids administered: normal saline      Cardiac interventions: none      Medications administered: none      Immobilization: C-collar      Airway condition since incident: stable      Breathing condition since incident: stable      Circulation condition since incident: stable      Mental status condition since incident: stable      Disability condition since incident: stable  Current symptoms:      Pain scale: 7/10      Pain quality: sharp      Pain timing: constant      Associated symptoms:            Reports chest pain (Left chest wall and ribs.) and loss of consciousness.  Relevant PMH:      Medical risk factors:            CML      Tetanus status: unknown   Past Medical History:  Diagnosis Date  . Cancer ([place])   . 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 Stroke [facility])    2006, 2008    Past Surgical History:  Procedure Laterality Date  . CHOLECYSTECTOMY     2005  . TONSILLECTOMY     1988  . VASECTOMY      No family history on file. Social History:  reports that he has never smoked. He does not have any smokeless tobacco history on file. He reports that he does not drink alcohol or use drugs.  Allergies  No Known Allergies  Home Medications   (Not in a hospital admission)  Trauma Course   Results for orders placed or performed during the hospital encounter of 07/18/17 (from the past 48 hour(s))  Ethanol     Status: None   Collection Time: 07/18/17  4:30 PM  Result Value Ref Range   Alcohol, Ethyl (B) <5 <5 mg/dL    Comment:        LOWEST DETECTABLE LIMIT FOR SERUM ALCOHOL IS 5 mg/dL FOR MEDICAL PURPOSES ONLY   CBC with Differential/Platelet     Status: Abnormal   Collection Time: 07/18/17   4:30 PM  Result Value Ref Range   WBC 12.Cody (H) 4.0 - 10.5 K/uL   RBC 4.64 4.22 - 5.81 MIL/uL   Hemoglobin 13.8 13.0 - 17.0 g/dL   HCT 41.0 39.0 - 52.0 %   MCV 88.4 78.0 - 100.0 fL   MCH 29.7 26.0 - 34.0 pg   MCHC 33.7 30.0 - 36.0 g/dL   RDW 14.1 11.5 - 15.5 %   Platelets 191 150 - 400 K/uL   Neutrophils Relative % 84 %   Neutro Abs 10.Cody (H) 1.7 - 7.7 K/uL   Lymphocytes Relative 8 %   Lymphs Abs 1.0 0.7 - 4.0 K/uL   Monocytes Relative 8 %   Monocytes Absolute 1.0 0.1 - 1.0 K/uL   Eosinophils Relative 0 %   Eosinophils Absolute 0.0 0.0 - 0.7 K/uL   Basophils Relative 0 %   Basophils Absolute 0.0 0.0 - 0.1 K/uL  CK     Status: Abnormal   Collection Time: 07/18/17  4:30 PM  Result Value Ref Range   Total CK 1,660 (H) 49 - 397 U/L  Comprehensive metabolic panel     Status: Abnormal   Collection Time: 07/18/17  4:30 PM  Result Value Ref Range   Sodium 138 135 - 145 mmol/L   Potassium 4.0 3.5 - 5.1 mmol/L   Chloride 105 101 - 111 mmol/L   CO2 23 22 - 32 mmol/L   Glucose, Bld 124 (H) 65 - 99 mg/dL   BUN 9 Cody - 20 mg/dL   Creatinine, Ser 0.74 0.61 - 1.24 mg/dL   Calcium 9.0 8.9 - 10.3 mg/dL   Total Protein 7.3 Cody.5 - 8.1 g/dL   Albumin 3.8 3.5 - 5.0 g/dL   AST 78 (H) 15 - 41 U/L   ALT 41 17 - 63 U/L   Alkaline Phosphatase 150 (H) 38 - 126 U/L   Total Bilirubin 1.4 (H) 0.3 - 1.2 mg/dL   GFR calc non Af Amer >60 >60 mL/min   GFR calc Af Amer >60 >60 mL/min    Comment: (NOTE) The eGFR has  been calculated using the CKD EPI equation. This calculation has not been validated in all clinical situations. eGFR's persistently <60 mL/min signify possible Chronic Kidney Disease.    Anion gap 10 5 - 15  Lipase, blood     Status: None   Collection Time: 07/18/17  4:30 PM  Result Value Ref Range   Lipase 24 11 - 51 U/L   Dg Pelvis 1-2 Views  Result Date: 07/18/2017 CLINICAL DATA:  Fall from tree stand EXAM: PELVIS - 1-2 VIEW COMPARISON:  None. FINDINGS: No acute fracture. No  dislocation.  Unremarkable soft tissues. IMPRESSION: No acute bony pathology. Electronically Signed   By: Marybelle Killings M.D.   On: 07/18/2017 17:57   Dg Shoulder Left  Result Date: 07/18/2017 CLINICAL DATA:  Fall from tree EXAM: LEFT SHOULDER - 2+ VIEW COMPARISON:  None. FINDINGS: There is a displaced fracture of the midclavicle. Multiple lateral left mid and upper rib fractures are suspected. Humerus is intact. Glenohumeral joint is anatomic. IMPRESSION: Possible left rib fractures. Mid clavicle fracture. Electronically Signed   By: Marybelle Killings M.D.   On: 07/18/2017 17:58   Dg Knee Complete 4 Views Left  Result Date: 07/18/2017 CLINICAL DATA:  Fall from tree stand with multiple injuries. Initial encounter. EXAM: LEFT KNEE - COMPLETE 4+ VIEW COMPARISON:  None. FINDINGS: No evidence of fracture, dislocation, or joint effusion. There is some potential soft tissue thickening involving the distal aspect of the quadriceps tendon just superior to the patella. Correlation suggested with any clinical evidence of tendon injury. No soft tissue foreign body. IMPRESSION: No evidence of fracture. Potential thickening/injury of the distal quadriceps tendon. Electronically Signed   By: Aletta Edouard M.D.   On: 07/18/2017 18:00   Dg Knee Complete 4 Views Right  Result Date: 07/18/2017 CLINICAL DATA:  Fall from a tree stand today. Pt is unsure if he lost consciousness or if the tree stand broke and fell. Pt c/o all over left shoulder pain and has no ROM in the left shoulder. EXAM: RIGHT KNEE - COMPLETE 4+ VIEW COMPARISON:  None. FINDINGS: There is no acute fracture or subluxation. No joint effusion. Along the posterior distal aspect of the femur, there is focal portable thickening, favored to represent a remote fibrous cortical defect. IMPRESSION: 1.  No evidence for acute  abnormality. 2. Probably benign fibrous cortical defect along the distal posterior aspect of the femur. In the absence of known malignancy, no  additional evaluation is needed. Electronically Signed   By: Nolon Nations M.D.   On: 07/18/2017 18:07   Dg Humerus Left  Result Date: 07/18/2017 CLINICAL DATA:  Fall from tree EXAM: LEFT HUMERUS - 2+ VIEW COMPARISON:  None. FINDINGS: No acute fracture. No dislocation.  Unremarkable soft tissues. IMPRESSION: No acute bony pathology. Electronically Signed   By: Marybelle Killings M.D.   On: 07/18/2017 17:58   Dg Hand Complete Right  Result Date: 07/18/2017 CLINICAL DATA:  Trauma.  Fall. EXAM: RIGHT HAND - COMPLETE 3+ VIEW COMPARISON:  None. FINDINGS: No acute fracture. No acute dislocation. Unremarkable soft tissues. IMPRESSION: No acute bony pathology. Electronically Signed   By: Marybelle Killings M.D.   On: 07/18/2017 18:03    Review of Systems  Constitutional: Negative.   Respiratory: Negative.   Cardiovascular: Positive for chest pain (Left chest wall and ribs.).  Gastrointestinal: Negative.   Genitourinary: Negative.   Musculoskeletal:       Left shoulder pain  Skin: Negative.   Neurological: Positive for loss of consciousness.  SpO2 97 %. Physical Exam  Constitutional: He is oriented to person, place, and time. He appears well-developed and well-nourished.  Large overweight man  HENT:  Head:    Right Ear: External ear normal.  Left Ear: External ear normal.  Eyes: Pupils are equal, round, and reactive to light. Conjunctivae and EOM are normal.  Neck:  Has neck pain.  Cardiovascular: Normal rate, regular rhythm and normal heart sounds.   Respiratory: Effort normal and breath sounds normal. He exhibits tenderness (left chest wall).    GI: Soft. Bowel sounds are normal. There is no tenderness.  Musculoskeletal:       Left shoulder: He exhibits decreased range of motion, tenderness, bony tenderness and swelling.  Neurological: He is alert and oriented to person, place, and time. He has normal reflexes.  Skin: Skin is warm and dry.  Psychiatric: He has a normal mood and affect.  His behavior is normal. Judgment and thought content normal.     Assessment/Plan Fall or hit by tree with the following injuries:  1. Mid scalp degloving laceration, >16 hours old, minimally contaminated, will be repaired by EDP with staples; 2. Left rib fractures 1, 4 and 5 fractures; 3. Small left pneumothorax, does not require chest tube at this time; 4. Left clavicle fracture, minimally displaced; 5. Neck pain, acute on chronic, will need flexion -extension films; Cody. Chronic lower back pain, chronic pain syndrome;  Will admit to the floor, repeat CXR in the AM.  Aspen collar for the neck until clearance can be done.  Arika Mainer 07/18/2017, Cody:19 PM   Procedures

## 2017-07-19 ENCOUNTER — Other Ambulatory Visit (HOSPITAL_COMMUNITY): Payer: Medicaid Other

## 2017-07-19 ENCOUNTER — Inpatient Hospital Stay (HOSPITAL_COMMUNITY): Payer: Medicaid Other

## 2017-07-19 LAB — CBC
HEMATOCRIT: 37.4 % — AB (ref 39.0–52.0)
Hemoglobin: 12.1 g/dL — ABNORMAL LOW (ref 13.0–17.0)
MCH: 28.9 pg (ref 26.0–34.0)
MCHC: 32.4 g/dL (ref 30.0–36.0)
MCV: 89.3 fL (ref 78.0–100.0)
PLATELETS: 172 10*3/uL (ref 150–400)
RBC: 4.19 MIL/uL — AB (ref 4.22–5.81)
RDW: 14.2 % (ref 11.5–15.5)
WBC: 10.8 10*3/uL — AB (ref 4.0–10.5)

## 2017-07-19 LAB — BASIC METABOLIC PANEL
ANION GAP: 6 (ref 5–15)
BUN: 6 mg/dL (ref 6–20)
CO2: 29 mmol/L (ref 22–32)
Calcium: 8.5 mg/dL — ABNORMAL LOW (ref 8.9–10.3)
Chloride: 106 mmol/L (ref 101–111)
Creatinine, Ser: 0.79 mg/dL (ref 0.61–1.24)
GFR calc Af Amer: >60 mL/min (ref >60)
GFR calc non Af Amer: >60 mL/min (ref >60)
GLUCOSE: 95 mg/dL (ref 65–99)
POTASSIUM: 4.2 mmol/L (ref 3.5–5.1)
Sodium: 141 mmol/L (ref 135–145)

## 2017-07-19 LAB — POCT I-STAT, CHEM 8
BUN: 11 mg/dL (ref 6–20)
CALCIUM ION: 1.05 mmol/L — AB (ref 1.15–1.40)
CHLORIDE: 103 mmol/L (ref 101–111)
Creatinine, Ser: 0.7 mg/dL (ref 0.61–1.24)
GLUCOSE: 132 mg/dL — AB (ref 65–99)
HCT: 42 % (ref 39.0–52.0)
HEMOGLOBIN: 14.3 g/dL (ref 13.0–17.0)
POTASSIUM: 3.9 mmol/L (ref 3.5–5.1)
SODIUM: 139 mmol/L (ref 135–145)
TCO2: 27 mmol/L (ref 0–100)

## 2017-07-19 LAB — HIV ANTIBODY (ROUTINE TESTING W REFLEX): HIV Screen 4th Generation wRfx: NONREACTIVE

## 2017-07-19 LAB — CG4 I-STAT (LACTIC ACID): LACTIC ACID, VENOUS: 1.46 mmol/L (ref 0.5–1.9)

## 2017-07-19 LAB — POCT I-STAT TROPONIN I: TROPONIN I, POC: 0 ng/mL (ref 0.00–0.08)

## 2017-07-19 MED ORDER — BACITRACIN ZINC 500 UNIT/GM EX OINT
TOPICAL_OINTMENT | Freq: Two times a day (BID) | CUTANEOUS | Status: DC
  Administered 2017-07-19 – 2017-07-20 (×3): via TOPICAL
  Filled 2017-07-19: qty 28.35

## 2017-07-19 MED ORDER — OXYCODONE HCL 5 MG PO TABS
10.0000 mg | ORAL_TABLET | ORAL | Status: DC | PRN
  Administered 2017-07-19 (×2): 15 mg via ORAL
  Administered 2017-07-20 (×4): 20 mg via ORAL
  Filled 2017-07-19: qty 3
  Filled 2017-07-19: qty 4
  Filled 2017-07-19: qty 3
  Filled 2017-07-19 (×3): qty 4

## 2017-07-19 MED ORDER — TRAMADOL HCL 50 MG PO TABS
50.0000 mg | ORAL_TABLET | Freq: Four times a day (QID) | ORAL | Status: DC
  Filled 2017-07-19: qty 1

## 2017-07-19 NOTE — Progress Notes (Signed)
[facility] Progress Note     Subjective: CC: pain in left shoulder and ribs Patient states that pain in left chest and shoulder is severe, especially with deep breaths. Reports a grinding sensation. Mild headache with soreness around scalp laceration. Patient is still amnestic to what happened, but feels like he is cognitively intact now. Thinks he may have fallen from the deer [place]. Denies any new numbness or tingling, abdominal pain, n/v, urinary sxs.   Objective: Vital signs in last 24 hours: Temp:  [98.5 F (36.9 C)-98.6 F (37 C)] 98.5 F (36.9 C) (08/09 0519) Pulse Rate:  [28-95] 95 (08/09 0519) Resp:  [17-30] 19 (08/09 0519) BP: (132-155)/(70-91) 132/70 (08/09 0519) SpO2:  [92 %-98 %] 94 % (08/09 0519) Weight:  [116.7 kg (257 lb 4.4 oz)] 116.7 kg (257 lb 4.4 oz) (08/09 0101) Last BM Date: 07/18/17  Intake/Output from previous day: 08/08 0701 - 08/09 0700 In: 1160 [P.O.:360; I.V.:700; IV Piggyback:100] Out: 900 [Urine:900] Intake/Output this shift: No intake/output data recorded.  PE: Gen:  Alert, NAD, pleasant Neck: supple, able to complete AROM without significant pain Card:  Regular rate and rhythm, pedal pulses 2+ BL, radial pulses 2+ BL, no lower extremity edema Pulm:  Normal effort, clear to auscultation bilaterally; pulling 1000 on IS Abd: Soft, non-tender, non-distended, bowel sounds present, no HSM MSK: left shoulder with mild edema and ecchymosis, sling present. Right shoulder normal. Grip 5/5 bilaterally.  Skin: warm and dry, no rashes. Abrasions to right chest and RUE. Scalp laceration well approximated with staples, no purulent drainage or fluctuance.  Psych: A&Ox3   Lab Results:   Recent Labs  07/18/17 1630 07/19/17 0414  WBC 12.6* 10.8*  HGB 13.8 12.1*  HCT 41.0 37.4*  PLT 191 172   BMET  Recent Labs  07/18/17 1630 07/19/17 0414  NA 138 141  K 4.0 4.2  CL 105 106  CO2 23 29  GLUCOSE 124* 95  BUN 9 6  CREATININE 0.74 0.79   CALCIUM 9.0 8.5*   CMP     Component Value Date/Time   NA 141 07/19/2017 0414   K 4.2 07/19/2017 0414   CL 106 07/19/2017 0414   CO2 29 07/19/2017 0414   GLUCOSE 95 07/19/2017 0414   BUN 6 07/19/2017 0414   CREATININE 0.79 07/19/2017 0414   CALCIUM 8.5 (L) 07/19/2017 0414   PROT 7.3 07/18/2017 1630   ALBUMIN 3.8 07/18/2017 1630   AST 78 (H) 07/18/2017 1630   ALT 41 07/18/2017 1630   ALKPHOS 150 (H) 07/18/2017 1630   BILITOT 1.4 (H) 07/18/2017 1630   GFRNONAA >60 07/19/2017 0414   GFRAA >60 07/19/2017 0414   Lipase     Component Value Date/Time   LIPASE 24 07/18/2017 1630    Studies/Results: Dg Pelvis 1-2 Views  Result Date: 07/18/2017 CLINICAL DATA:  Fall from tree stand EXAM: PELVIS - 1-2 VIEW COMPARISON:  None. FINDINGS: No acute fracture. No dislocation.  Unremarkable soft tissues. IMPRESSION: No acute bony pathology. Electronically Signed   By: Marybelle Killings M.D.   On: 07/18/2017 17:57   Ct Head Wo Contrast  Result Date: 07/18/2017 CLINICAL DATA:  Trauma EXAM: CT HEAD WITHOUT CONTRAST CT CERVICAL SPINE WITHOUT CONTRAST TECHNIQUE: Multidetector CT imaging of the head and cervical spine was performed following the standard protocol without intravenous contrast. Multiplanar CT image reconstructions of the cervical spine were also generated. COMPARISON:  03/06/2017 FINDINGS: CT HEAD FINDINGS Brain: There is no mass effect, midline shift, or acute intracranial  hemorrhage. Brain parenchyma and ventricular system are unremarkable. Vascular: No hyperdense vessel or unexpected calcification. Skull: Cranium is intact. Sinuses/Orbits: Mastoid air cells and visualized paranasal sinuses are clear. Other: Soft tissue injury over the vertex is noted. CT CERVICAL SPINE FINDINGS Alignment: Anatomic Skull base and vertebrae: No acute fracture or dislocation. Anterior plate and screws are present at C5 and C6 for interbody fusion. There is solid fusion across the disc space. No breakage or  loosening of the hardware. Soft tissues and spinal canal: No obvious soft tissue hematoma. No obvious spinal hematoma. Disc levels:  No obvious disc herniation or spinal stenosis. Upper chest: Small left pneumothorax. Other: Noncontributory. IMPRESSION: No acute intracranial pathology. No evidence of cervical spine injury. C5-6 anterior interbody fusion is intact. Left pneumothorax. Electronically Signed   By: Marybelle Killings M.D.   On: 07/18/2017 18:56   Ct Chest W Contrast  Result Date: 07/18/2017 CLINICAL DATA:  Trauma with evidence of small left pneumothorax and left-sided rib and clavicle fractures by chest x-ray. EXAM: CT CHEST, ABDOMEN, AND PELVIS WITH CONTRAST TECHNIQUE: Multidetector CT imaging of the chest, abdomen and pelvis was performed following the standard protocol during bolus administration of intravenous contrast. CONTRAST:  154mL ISOVUE-300 IOPAMIDOL (ISOVUE-300) INJECTION 61% COMPARISON:  Chest x-ray earlier today. FINDINGS: CT CHEST FINDINGS Cardiovascular: No evidence of mediastinal hemorrhage. The thoracic aorta appears normal. The heart size is normal. No pericardial fluid. Mediastinum/Nodes: No enlarged mediastinal, hilar, or axillary lymph nodes. Thyroid gland, trachea, and esophagus demonstrate no significant findings. Lungs/Pleura: Anterior pneumothorax on the left of approximately 10% volume. There is atelectasis in the left lower lung. Musculoskeletal: Nondisplaced medial left first rib fracture. Mildly displaced lateral left fourth rib fracture. Nondisplaced lateral left fifth and sixth rib fractures. Subcutaneous hemorrhage noted in the medial left upper pectoral region. The left clavicle is incompletely imaged in the left clavicular fracture seen by chest and shoulder x-ray is not assessed. CT ABDOMEN PELVIS FINDINGS Hepatobiliary: No hepatic injury or perihepatic hematoma. Status post cholecystectomy. No biliary ductal dilatation identified. Pancreas: Unremarkable. No pancreatic  ductal dilatation or surrounding inflammatory changes. Spleen: No splenic injury or perisplenic hematoma. Adrenals/Urinary Tract: No adrenal hemorrhage or renal injury identified. Bladder is unremarkable. Stomach/Bowel: Bowel is unremarkable and shows no evidence of dilatation or thickening. No free air or free fluid. No abscess. Vascular/Lymphatic: No significant vascular findings are present. No enlarged abdominal or pelvic lymph nodes. Reproductive: Prostate is unremarkable. Other: No abdominal wall hernia or abnormality. No abdominopelvic ascites. Musculoskeletal: No acute or significant osseous findings. IMPRESSION: 1. Left pneumothorax of approximately 10% volume with associated left lower lung atelectasis. Nondisplaced left first, fifth and sixth rib fractures. Mildly displaced left fourth rib fracture. 2. Some subcutaneous chest wall hemorrhage is noted in the upper left medial pectoral region. 3. No acute intra-abdominal or pelvic injuries. Electronically Signed   By: Aletta Edouard M.D.   On: 07/18/2017 19:07   Ct Cervical Spine Wo Contrast  Result Date: 07/18/2017 CLINICAL DATA:  Trauma EXAM: CT HEAD WITHOUT CONTRAST CT CERVICAL SPINE WITHOUT CONTRAST TECHNIQUE: Multidetector CT imaging of the head and cervical spine was performed following the standard protocol without intravenous contrast. Multiplanar CT image reconstructions of the cervical spine were also generated. COMPARISON:  03/06/2017 FINDINGS: CT HEAD FINDINGS Brain: There is no mass effect, midline shift, or acute intracranial hemorrhage. Brain parenchyma and ventricular system are unremarkable. Vascular: No hyperdense vessel or unexpected calcification. Skull: Cranium is intact. Sinuses/Orbits: Mastoid air cells and visualized paranasal sinuses are clear.  Other: Soft tissue injury over the vertex is noted. CT CERVICAL SPINE FINDINGS Alignment: Anatomic Skull base and vertebrae: No acute fracture or dislocation. Anterior plate and screws  are present at C5 and C6 for interbody fusion. There is solid fusion across the disc space. No breakage or loosening of the hardware. Soft tissues and spinal canal: No obvious soft tissue hematoma. No obvious spinal hematoma. Disc levels:  No obvious disc herniation or spinal stenosis. Upper chest: Small left pneumothorax. Other: Noncontributory. IMPRESSION: No acute intracranial pathology. No evidence of cervical spine injury. C5-6 anterior interbody fusion is intact. Left pneumothorax. Electronically Signed   By: Marybelle Killings M.D.   On: 07/18/2017 18:56   Ct Abdomen Pelvis W Contrast  Result Date: 07/18/2017 CLINICAL DATA:  Trauma with evidence of small left pneumothorax and left-sided rib and clavicle fractures by chest x-ray. EXAM: CT CHEST, ABDOMEN, AND PELVIS WITH CONTRAST TECHNIQUE: Multidetector CT imaging of the chest, abdomen and pelvis was performed following the standard protocol during bolus administration of intravenous contrast. CONTRAST:  145mL ISOVUE-300 IOPAMIDOL (ISOVUE-300) INJECTION 61% COMPARISON:  Chest x-ray earlier today. FINDINGS: CT CHEST FINDINGS Cardiovascular: No evidence of mediastinal hemorrhage. The thoracic aorta appears normal. The heart size is normal. No pericardial fluid. Mediastinum/Nodes: No enlarged mediastinal, hilar, or axillary lymph nodes. Thyroid gland, trachea, and esophagus demonstrate no significant findings. Lungs/Pleura: Anterior pneumothorax on the left of approximately 10% volume. There is atelectasis in the left lower lung. Musculoskeletal: Nondisplaced medial left first rib fracture. Mildly displaced lateral left fourth rib fracture. Nondisplaced lateral left fifth and sixth rib fractures. Subcutaneous hemorrhage noted in the medial left upper pectoral region. The left clavicle is incompletely imaged in the left clavicular fracture seen by chest and shoulder x-ray is not assessed. CT ABDOMEN PELVIS FINDINGS Hepatobiliary: No hepatic injury or perihepatic  hematoma. Status post cholecystectomy. No biliary ductal dilatation identified. Pancreas: Unremarkable. No pancreatic ductal dilatation or surrounding inflammatory changes. Spleen: No splenic injury or perisplenic hematoma. Adrenals/Urinary Tract: No adrenal hemorrhage or renal injury identified. Bladder is unremarkable. Stomach/Bowel: Bowel is unremarkable and shows no evidence of dilatation or thickening. No free air or free fluid. No abscess. Vascular/Lymphatic: No significant vascular findings are present. No enlarged abdominal or pelvic lymph nodes. Reproductive: Prostate is unremarkable. Other: No abdominal wall hernia or abnormality. No abdominopelvic ascites. Musculoskeletal: No acute or significant osseous findings. IMPRESSION: 1. Left pneumothorax of approximately 10% volume with associated left lower lung atelectasis. Nondisplaced left first, fifth and sixth rib fractures. Mildly displaced left fourth rib fracture. 2. Some subcutaneous chest wall hemorrhage is noted in the upper left medial pectoral region. 3. No acute intra-abdominal or pelvic injuries. Electronically Signed   By: Aletta Edouard M.D.   On: 07/18/2017 19:07   Dg Chest Port 1 View  Result Date: 07/19/2017 CLINICAL DATA:  Trauma; patient hit by a tree EXAM: PORTABLE CHEST 1 VIEW COMPARISON:  Chest radiograph and chest CT July 18, 2017 FINDINGS: The left-sided pneumothorax noted on CT 1 day prior is not appreciable by radiography. There are several rib fractures noted on the left. There is left base and left perihilar atelectasis. Lungs elsewhere are clear. Heart size and pulmonary vascularity are within normal limits. No adenopathy. Postoperative changes noted in the lower cervical spine. There is a displaced fracture of the left clavicle at the junction of the mid and distal thirds. IMPRESSION: Multiple fractures on the left. Pneumothorax seen on recent CT is not evident on portable chest radiographic examination. There is left  base  and left perihilar atelectasis. Lungs elsewhere clear. Cardiac silhouette within normal limits. Electronically Signed   By: Lowella Grip III M.D.   On: 07/19/2017 08:06   Dg Chest Portable 1 View  Result Date: 07/18/2017 CLINICAL DATA:  Struck by tree EXAM: PORTABLE CHEST 1 VIEW COMPARISON:  03/25/2015 FINDINGS: Upper normal heart size. Low volumes. Bibasilar atelectasis. Aortic knob is indistinct. Left apical pleural thickening. Acute fractures of the left first and fourth ribs. Left clavicle fracture with displacement. Tiny left apical pneumothorax. IMPRESSION: Bibasilar atelectasis. Aortic knob is indistinct.  Mediastinal hemorrhage not excluded. Tiny left pneumothorax. Left first and fourth rib fractures. Left apical pleural thickening. Electronically Signed   By: Marybelle Killings M.D.   On: 07/18/2017 19:05   Dg Cerv Spine Flex&ext Only  Result Date: 07/18/2017 CLINICAL DATA:  Pt. [place] out of [place] tonight. No neck pain.Left sided rib and clavicle fractures and pneumothoraxPt. Unable to relax shoulders down due to fractures and chest pain EXAM: CERVICAL SPINE - FLEXION AND EXTENSION VIEWS ONLY COMPARISON:  Current cervical CT. FINDINGS: The superimposed shoulders obscures the the lower cervical spine, from C6 inferiorly. There is no fracture. There is no spondylolisthesis. The visualized anterior fusion plate at G2-I9 appears well-seated. There is no subluxation with flexion or extension, and no movement of the orthopedic hardware. Soft tissues are unremarkable. IMPRESSION: 1. No fracture. No subluxation with flexion or extension. Lower cervical spine not visualized due to overlying soft tissues. Electronically Signed   By: Lajean Manes M.D.   On: 07/18/2017 21:07   Dg Shoulder Left  Result Date: 07/18/2017 CLINICAL DATA:  Fall from tree EXAM: LEFT SHOULDER - 2+ VIEW COMPARISON:  None. FINDINGS: There is a displaced fracture of the midclavicle. Multiple lateral left mid and upper rib fractures  are suspected. Humerus is intact. Glenohumeral joint is anatomic. IMPRESSION: Possible left rib fractures. Mid clavicle fracture. Electronically Signed   By: Marybelle Killings M.D.   On: 07/18/2017 17:58   Dg Knee Complete 4 Views Left  Result Date: 07/18/2017 CLINICAL DATA:  Fall from tree stand with multiple injuries. Initial encounter. EXAM: LEFT KNEE - COMPLETE 4+ VIEW COMPARISON:  None. FINDINGS: No evidence of fracture, dislocation, or joint effusion. There is some potential soft tissue thickening involving the distal aspect of the quadriceps tendon just superior to the patella. Correlation suggested with any clinical evidence of tendon injury. No soft tissue foreign body. IMPRESSION: No evidence of fracture. Potential thickening/injury of the distal quadriceps tendon. Electronically Signed   By: Aletta Edouard M.D.   On: 07/18/2017 18:00   Dg Knee Complete 4 Views Right  Result Date: 07/18/2017 CLINICAL DATA:  Fall from a tree stand today. Pt is unsure if he lost consciousness or if the tree stand broke and fell. Pt c/o all over left shoulder pain and has no ROM in the left shoulder. EXAM: RIGHT KNEE - COMPLETE 4+ VIEW COMPARISON:  None. FINDINGS: There is no acute fracture or subluxation. No joint effusion. Along the posterior distal aspect of the femur, there is focal portable thickening, favored to represent a remote fibrous cortical defect. IMPRESSION: 1.  No evidence for acute  abnormality. 2. Probably benign fibrous cortical defect along the distal posterior aspect of the femur. In the absence of known malignancy, no additional evaluation is needed. Electronically Signed   By: Nolon Nations M.D.   On: 07/18/2017 18:07   Dg Humerus Left  Result Date: 07/18/2017 CLINICAL DATA:  Fall from tree EXAM:  LEFT HUMERUS - 2+ VIEW COMPARISON:  None. FINDINGS: No acute fracture. No dislocation.  Unremarkable soft tissues. IMPRESSION: No acute bony pathology. Electronically Signed   By: Marybelle Killings M.D.    On: 07/18/2017 17:58   Dg Hand Complete Right  Result Date: 07/18/2017 CLINICAL DATA:  Trauma.  Fall. EXAM: RIGHT HAND - COMPLETE 3+ VIEW COMPARISON:  None. FINDINGS: No acute fracture. No acute dislocation. Unremarkable soft tissues. IMPRESSION: No acute bony pathology. Electronically Signed   By: Marybelle Killings M.D.   On: 07/18/2017 18:03    Anti-infectives: Anti-infectives    Start     Dose/Rate Route Frequency Ordered Stop   07/18/17 2200  ceFAZolin (ANCEF) IVPB 1 g/50 mL premix     1 g 100 mL/hr over 30 Minutes Intravenous Every 8 hours 07/18/17 1902         Assessment/Plan Fall? Mid scalp degloving laceration - repaired in ED with staples, will need staple removal in 7-10 days - bacitracin BID Left rib 1,4-5 fractures with small left ptx - CXR: rib fxs, no ptx - IS, pulm toilet Left clavicle fracture  - ortho consulted - sling for comfort; NWB on LUE until evaluated by ortho Neck pain - acute on chronic - flexion/extension films done - cleared clinically  - discontinue C collar  Chronic low back pain CML  FEN - regular diet VTE - SCDs, lovenox ID - ancef for contaminated scalp laceration   Dispo: Ortho to see for left clavicle. PT/OT. Pain control.   LOS: 1 day    Brigid Re , [facility] 07/19/2017, 8:36 AM Pager: [phone] Trauma Pager: [phone] Mon-Fri 7:00 am-4:30 pm Sat-Sun 7:00 am-11:30 am

## 2017-07-19 NOTE — Care Management Note (Signed)
Case Management Note  Patient Details  Name: NORVIN OHLIN MRN: [id number] Date of Birth: [birth year]  Subjective/Objective:   Pt admitted on 07/18/17 after reportedly falling out of a deer stand.  He sustained a mid-scalp degloving laceration, multiple Lt rib fractures, small Lt pneumothorax, and Lt clavicle fx.  PTA, pt resided at home with his parents, who live in [place].                    Action/Plan: PT/OT consults pending.  Will follow for discharge planning as pt progresses.  Pt states parents able to assist at dc.     Expected Discharge Date:                  Expected Discharge Plan:     In-House Referral:  Clinical Social Work  Discharge planning Services  CM Consult  Post Acute Care Choice:    Choice offered to:     DME Arranged:    DME Agency:     HH Arranged:    HH Agency:     Status of Service:  In process, will continue to follow  If discussed at Long Length of Stay Meetings, dates discussed:    Additional Comments:  Reinaldo Raddle, RN, BSN  Trauma/Neuro ICU Case Manager [phone]

## 2017-07-19 NOTE — Progress Notes (Signed)
Orthopedic Tech Progress Note Patient Details:  Cody Nunez 1971-02-09 [id number]  Ortho Devices Type of Ortho Device: Arm sling Ortho Device/Splint Location: lue Ortho Device/Splint Interventions: Ordered, Application, Adjustment   Karolee Stamps 07/19/2017, 2:16 AM

## 2017-07-19 NOTE — Evaluation (Signed)
Physical Therapy Evaluation Patient Details Name: Cody Nunez MRN: [id number] DOB: [birth year] Today's Date: 07/19/2017   History of Present Illness  Patient is a 46 y/o male with PMH of CML, chronic pain in neck and back who apparently fell from a deer stand with scalp laceration, L rib fx's 1, 4-5 and  L clavicle fx.  Clinical Impression  Patient presents with decreased independence with mobility due to deficits listed in PT problem list.  He will benefit from skilled PT in the acute setting to allow return home following CIR level rehab stay.  Currently needing assist for transitional movements due to pain and limited L UE use.  Feel currently not safe for d/c home due to reports his elderly parents are unable to assist physically and cognitively.  Did encourage continued mobility with nursing assist as able.     Follow Up Recommendations CIR    Equipment Recommendations  Other (comment) (TBA, cane?)    Recommendations for Other Services Rehab consult     Precautions / Restrictions Precautions Precautions: Fall Required Braces or Orthoses: Sling Restrictions Weight Bearing Restrictions: Yes LUE Weight Bearing: Non weight bearing      Mobility  Bed Mobility Overal bed mobility: Needs Assistance Bed Mobility: Sit to Supine;Supine to Sit     Supine to sit: HOB elevated;Mod assist Sit to supine: HOB elevated;Min assist   General bed mobility comments: utilized HOB to elevate trunk due to pt too painful in ribs to mobilize without assist, able to scoot and bring legs off bed with min A, then pt pulled up with R UE to sit; to supine assist with HOB elevated and minguard for positioning in bed  Transfers Overall transfer level: Needs assistance Equipment used: 1 person hand held assist Transfers: Sit to/from Stand Sit to Stand: Min assist;From elevated surface         General transfer comment: pt attempted twice from normal height of bed and too painful in ribs with  forward translation, but able to stand with min A from elevated height  Ambulation/Gait Ambulation/Gait assistance: Min guard Ambulation Distance (Feet): 12 Feet (x 2) Assistive device: None;1 person hand held assist Gait Pattern/deviations: Step-through pattern;Decreased stride length     General Gait Details: around bed to chair, then back to bed, HHA initially for support, then minguard to S for safety back around to bed  Stairs            Wheelchair Mobility    Modified Rankin (Stroke Patients Only)       Balance Overall balance assessment: Needs assistance   Sitting balance-Leahy Scale: Good     Standing balance support: No upper extremity supported Standing balance-Leahy Scale: Fair                               Pertinent Vitals/Pain Pain Assessment: Faces Faces Pain Scale: Hurts whole lot Pain Location: L clavicle, ribs Pain Descriptors / Indicators: Grimacing;Guarding Pain Intervention(s): Monitored during session;Repositioned    Home Living Family/patient expects to be discharged to:: Private residence Living Arrangements: Parent Available Help at Discharge: Family Type of Home: House Home Access: Level entry     Home Layout: One level Home Equipment: None      Prior Function Level of Independence: Independent               Hand Dominance        Extremity/Trunk Assessment   Upper Extremity Assessment Upper  Extremity Assessment: LUE deficits/detail LUE Deficits / Details: in sling, able to use hand and move arm, but cues not to move as much LUE: Unable to fully assess due to immobilization            Communication   Communication: No difficulties  Cognition Arousal/Alertness: Awake/alert Behavior During Therapy: WFL for tasks assessed/performed Overall Cognitive Status: Within Functional Limits for tasks assessed                                        General Comments General comments (skin  integrity, edema, etc.): pt able to demonstrate 1250 ml on incentive spiromenter after ambulation; discussed options for d/c and encouraged further ambulation with nursing now that he knew a way to get OOB with less pain    Exercises     Assessment/Plan    PT Assessment Patient needs continued PT services  PT Problem List Decreased strength;Decreased mobility;Decreased range of motion;Decreased activity tolerance;Decreased balance;Pain;Decreased safety awareness       PT Treatment Interventions Gait training;DME instruction;Therapeutic activities;Therapeutic exercise;Patient/family education;Balance training;Functional mobility training    PT Goals (Current goals can be found in the Care Plan section)  Acute Rehab PT Goals Patient Stated Goal: To return to independent PT Goal Formulation: With patient Time For Goal Achievement: 07/26/17 Potential to Achieve Goals: Good    Frequency Min 5X/week   Barriers to discharge Decreased caregiver support      Co-evaluation               AM-PAC PT "6 Clicks" Daily Activity  Outcome Measure Difficulty turning over in bed (including adjusting bedclothes, sheets and blankets)?: Total Difficulty moving from lying on back to sitting on the side of the bed? : Total Difficulty sitting down on and standing up from a chair with arms (e.g., wheelchair, bedside commode, etc,.)?: Total Help needed moving to and from a bed to chair (including a wheelchair)?: A Little Help needed walking in hospital room?: A Little Help needed climbing 3-5 steps with a railing? : A Little 6 Click Score: 12    End of Session   Activity Tolerance: Patient limited by pain Patient left: in bed;with call bell/phone within reach   PT Visit Diagnosis: Pain;Other abnormalities of gait and mobility (R26.89) Pain - Right/Left: Left Pain - part of body: Shoulder    Time: 8592-9244 PT Time Calculation (min) (ACUTE ONLY): 27 min   Charges:   PT Evaluation $PT  Eval Moderate Complexity: 1 Mod PT Treatments $Gait Training: 8-22 mins   PT G CodesMagda Kiel, [place] [phone] 07/19/2017   Reginia Naas 07/19/2017, 5:21 PM

## 2017-07-20 DIAGNOSIS — I10 Essential (primary) hypertension: Secondary | ICD-10-CM

## 2017-07-20 DIAGNOSIS — G894 Chronic pain syndrome: Secondary | ICD-10-CM

## 2017-07-20 DIAGNOSIS — S2249XD Multiple fractures of ribs, unspecified side, subsequent encounter for fracture with routine healing: Secondary | ICD-10-CM

## 2017-07-20 DIAGNOSIS — D72829 Elevated white blood cell count, unspecified: Secondary | ICD-10-CM

## 2017-07-20 DIAGNOSIS — T1490XA Injury, unspecified, initial encounter: Secondary | ICD-10-CM

## 2017-07-20 DIAGNOSIS — W19XXXD Unspecified fall, subsequent encounter: Secondary | ICD-10-CM

## 2017-07-20 DIAGNOSIS — R739 Hyperglycemia, unspecified: Secondary | ICD-10-CM

## 2017-07-20 DIAGNOSIS — W19XXXA Unspecified fall, initial encounter: Secondary | ICD-10-CM

## 2017-07-20 DIAGNOSIS — D62 Acute posthemorrhagic anemia: Secondary | ICD-10-CM

## 2017-07-20 DIAGNOSIS — S42002D Fracture of unspecified part of left clavicle, subsequent encounter for fracture with routine healing: Secondary | ICD-10-CM

## 2017-07-20 DIAGNOSIS — Z806 Family history of leukemia: Secondary | ICD-10-CM

## 2017-07-20 DIAGNOSIS — S42002A Fracture of unspecified part of left clavicle, initial encounter for closed fracture: Secondary | ICD-10-CM

## 2017-07-20 LAB — BASIC METABOLIC PANEL
Anion gap: 5 (ref 5–15)
BUN: 5 mg/dL — AB (ref 6–20)
CHLORIDE: 104 mmol/L (ref 101–111)
CO2: 29 mmol/L (ref 22–32)
CREATININE: 0.81 mg/dL (ref 0.61–1.24)
Calcium: 8.7 mg/dL — ABNORMAL LOW (ref 8.9–10.3)
Glucose, Bld: 158 mg/dL — ABNORMAL HIGH (ref 65–99)
Potassium: 3.9 mmol/L (ref 3.5–5.1)
SODIUM: 138 mmol/L (ref 135–145)

## 2017-07-20 MED ORDER — IBUPROFEN 600 MG PO TABS: 600.0000 mg | ORAL_TABLET | Freq: Four times a day (QID) | ORAL | Status: DC | PRN

## 2017-07-20 MED ORDER — BACITRACIN ZINC 500 UNIT/GM EX OINT: TOPICAL_OINTMENT | Freq: Two times a day (BID) | CUTANEOUS | 0 refills | Status: DC

## 2017-07-20 MED ORDER — ONDANSETRON HCL 4 MG PO TABS: 4.0000 mg | ORAL_TABLET | Freq: Three times a day (TID) | ORAL | Status: DC | PRN

## 2017-07-20 MED ORDER — OXYCODONE HCL 10 MG PO TABS: 10.0000 mg | ORAL_TABLET | ORAL | 0 refills | Status: DC | PRN

## 2017-07-20 MED ORDER — METHOCARBAMOL 750 MG PO TABS: 750.0000 mg | ORAL_TABLET | Freq: Three times a day (TID) | ORAL | 0 refills | Status: DC

## 2017-07-20 MED ORDER — KETOROLAC TROMETHAMINE 60 MG/2ML IM SOLN
60.0000 mg | Freq: Once | INTRAMUSCULAR | Status: AC
  Administered 2017-07-20: 60 mg via INTRAMUSCULAR
  Filled 2017-07-20: qty 2

## 2017-07-20 NOTE — Discharge Instructions (Signed)
Clavicle Fracture A clavicle fracture is a break in the long bone that connects the shoulder to the chest wall (clavicle). The clavicle is also called the collarbone. A clavicle fracture is a common injury that can happen at any age. What are the causes? Common causes of a clavicle fracture include:  A hard, direct hit (blow) to the shoulder.  A car accident.  A fall, especially if you try to break your fall with an outstretched arm.  What increases the risk? You are more likely to develop this condition if:  You are younger than 25 or older than 75. Most clavicle fractures happen to people who are younger than 25.  You are a male.  You play contact sports.  What are the signs or symptoms? Symptoms of this condition include:  Pain.  Difficulty moving the arm.  A shoulder that drops downward and forward.  Pain when trying to lift the shoulder.  Bruising, swelling, and tenderness over the clavicle.  A grinding noise when you try to move the shoulder.  A bump over the clavicle.  How is this diagnosed? This condition is diagnosed based on:  Your medical history.  A physical exam.  X-rays to determine the position of your clavicle.  How is this treated? Treatment for this condition depends on the position of your clavicle after the fracture:  If the broken ends of the bone are not out of place, your health care provider may put your arm in a sling.  If the broken ends of the bone are out of place, you may need surgery. Surgery may involve placing screws, pins, or plates to keep your clavicle stable while it heals.  When your health care provider thinks your fracture has healed enough, you may have to do physical therapy to regain normal movement and build up your arm strength. Follow these instructions at home: If you have a sling:  Wear the sling as told by your health care provider. Remove it only as told by your health care provider.  Loosen the sling if your  fingers tingle, become numb, or turn cold and blue.  Do not lift your arm. Keep it across your chest.  Keep the sling clean.  Ask your health care provider if you may remove the sling for bathing. ? If your sling is not waterproof, do not let it get wet. Cover the sling with a watertight covering if you take a bath or a shower while wearing it. ? If you may remove your sling when you take a bath or a shower, keep your shoulder in the same position as when the sling is on. Managing pain, stiffness, and swelling  If directed, put ice on the injured area: ? If you have a removable sling, remove it as told by your health care provider. ? Put ice in a plastic bag. ? Place a towel between your skin and the bag. ? Leave the ice on for 20 minutes, 2-3 times a day. Activity  Avoid activities that make your symptoms worse for 4-6 weeks, or as long as directed.  Ask your health care provider when it is safe for you to drive.  Do exercises as told by your health care provider. General instructions  Do not use any products that contain nicotine or tobacco, such as cigarettes and e-cigarettes. These can delay bone healing. If you need help quitting, ask your health care provider.  Take over-the-counter and prescription medicines only as told by your health care provider.    Keep all follow-up visits as told by your health care provider. This is important. Contact a health care provider if:  Your medicine is not helping to relieve pain and swelling. Get help right away if:  Your arm is numb, cold, or pale, even when your splint is loose. Summary  The clavicle, also called the collarbone, is the long bone that connects the shoulder to the chest wall.  Treatment for this condition depends on the position of your clavicle after the fracture.  You may need to do physical therapy after your injury has healed enough.  If you have a sling, wear the sling as told by your health care  provider. This information is not intended to replace advice given to you by your health care provider. Make sure you discuss any questions you have with your health care provider. Document Released: 09/06/2005 Document Revised: 10/16/2016 Document Reviewed: 10/16/2016 [employer] Interactive Patient Education  2017 [employer]  
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
## 2017-07-20 NOTE — Progress Notes (Signed)
Pt stating that his pain is 10/10 and could not get up and go the bathroom. He thinks that his fx on clavicle is worse. He requested IV pain medicine and wanted it to be pushed fast and flushed after it. This RN explained that he has IV fluid running and that the pain meds is going in. Pt got loud and said he was hurting too bad to wait. IV tube flushed with 3 cc of saline as requested. VSS. Will continue to monitor.

## 2017-07-20 NOTE — Care Management Note (Signed)
Case Management Note  Patient Details  Name: Cody Nunez MRN: [id number] Date of Birth: Apr 23, 1971  Subjective/Objective:   Pt admitted on 07/18/17 after reportedly falling out of a deer stand.  He sustained a mid-scalp degloving laceration, multiple Lt rib fractures, small Lt pneumothorax, and Lt clavicle fx.  PTA, pt resided at home with his parents, who live in [place].                    Action/Plan: PT/OT consults pending.  Will follow for discharge planning as pt progresses.  Pt states parents able to assist at dc.     Expected Discharge Date:     07/20/17             Expected Discharge Plan:  Home/Self Care  In-House Referral:  Clinical Social Work  Discharge planning Services  CM Consult  Post Acute Care Choice:    Choice offered to:     DME Arranged:  Hospital bed DME Agency:  [place]:    [facility] Agency:     Status of Service:  Completed, signed off  If discussed at [place] of Stay Meetings, dates discussed:    Additional Comments:  07/20/17 J. Concetta Guion, RN, BSN Pt medically stable for discharge home with parents. OT recommending hospital bed for home, and pt is agreeable.  Referral to [facility] for DME needs.  Pt states that his parents are at home and awaiting delivery of bed.    Reinaldo Raddle, RN, BSN  Trauma/Neuro ICU Case Manager [phone]

## 2017-07-20 NOTE — Progress Notes (Signed)
Patient discharged to home with instructions and prescriptions. 

## 2017-07-20 NOTE — Plan of Care (Signed)
Problem: Pain Managment: Goal: General experience of comfort will improve Outcome: Not Progressing Pt's pain is not progressing.PRN pain medications is being given in timely manner with no relief. Pt refusing tramadol. Offered ice for comfort. Will Continue to reassess.

## 2017-07-20 NOTE — Progress Notes (Signed)
[facility] Progress Note     Subjective: CC: pain in left upper extremity Patient complaining of severe pain in left shoulder from clavicle. States he also is having headaches. Denies SOB, or chest pain. Denies abdominal pain, tolerating diet. Unsure about going home, because his parents are elderly and would not be able to assist him. Pulling 750 on IS  Objective: Vital signs in last 24 hours: Temp:  [98.2 F (36.8 C)-98.7 F (37.1 C)] 98.5 F (36.9 C) (08/10 0618) Pulse Rate:  [70-82] 70 (08/10 0618) Resp:  [18-20] 18 (08/10 0618) BP: (105-118)/(50-70) 105/55 (08/10 0618) SpO2:  [95 %-98 %] 98 % (08/10 0618) Last BM Date: 07/18/17  Intake/Output from previous day: 08/09 0701 - 08/10 0700 In: 4161.7 [P.O.:1800; I.V.:2311.7; IV Piggyback:50] Out: 5329 [Urine:3275] Intake/Output this shift: No intake/output data recorded.  PE: Gen:  Alert, NAD, pleasant Neck: supple, able to complete AROM without significant pain Card:  Regular rate and rhythm, pedal pulses 2+ BL, radial pulses 2+ BL, no lower extremity edema Pulm:  Normal effort, clear to auscultation bilaterally Abd: Soft, non-tender, non-distended, bowel sounds present, no HSM MSK: left shoulder with mild edema and ecchymosis, sling present. Right shoulder normal. Grip 5/5 bilaterally.  Skin: warm and dry, no rashes. Abrasions to right chest and RUE. Scalp laceration well approximated with staples, no purulent drainage or fluctuance.  Psych: A&Ox3   Lab Results:   Recent Labs  07/18/17 1630 07/19/17 0414  WBC 12.6* 10.8*  HGB 13.8 12.1*  HCT 41.0 37.4*  PLT 191 172   BMET  Recent Labs  07/18/17 1630 07/19/17 0414  NA 138 141  K 4.0 4.2  CL 105 106  CO2 23 29  GLUCOSE 124* 95  BUN 9 6  CREATININE 0.74 0.79  CALCIUM 9.0 8.5*   CMP     Component Value Date/Time   NA 141 07/19/2017 0414   K 4.2 07/19/2017 0414   CL 106 07/19/2017 0414   CO2 29 07/19/2017 0414   GLUCOSE 95 07/19/2017  0414   BUN 6 07/19/2017 0414   CREATININE 0.79 07/19/2017 0414   CALCIUM 8.5 (L) 07/19/2017 0414   PROT 7.3 07/18/2017 1630   ALBUMIN 3.8 07/18/2017 1630   AST 78 (H) 07/18/2017 1630   ALT 41 07/18/2017 1630   ALKPHOS 150 (H) 07/18/2017 1630   BILITOT 1.4 (H) 07/18/2017 1630   GFRNONAA >60 07/19/2017 0414   GFRAA >60 07/19/2017 0414   Lipase     Component Value Date/Time   LIPASE 24 07/18/2017 1630    Studies/Results: Dg Pelvis 1-2 Views  Result Date: 07/18/2017 CLINICAL DATA:  Fall from tree stand EXAM: PELVIS - 1-2 VIEW COMPARISON:  None. FINDINGS: No acute fracture. No dislocation.  Unremarkable soft tissues. IMPRESSION: No acute bony pathology. Electronically Signed   By: Marybelle Killings M.D.   On: 07/18/2017 17:57   Dg Clavicle Left  Result Date: 07/19/2017 CLINICAL DATA:  Fall out of tree yesterday. EXAM: LEFT CLAVICLE - 2+ VIEWS COMPARISON:  None. FINDINGS: Examination demonstrates a displaced left midclavicular fracture. There are displaced fractures of the lateral left third and fourth ribs and possible fracture of the posterolateral aspect of the left first rib. IMPRESSION: Displaced left midclavicular fracture. Fractures of the left lateral third and fourth ribs and possibly the posterior left first rib. Electronically Signed   By: Marin Olp M.D.   On: 07/19/2017 13:21   Ct Head Wo Contrast  Result Date: 07/18/2017 CLINICAL DATA:  Trauma EXAM:  CT HEAD WITHOUT CONTRAST CT CERVICAL SPINE WITHOUT CONTRAST TECHNIQUE: Multidetector CT imaging of the head and cervical spine was performed following the standard protocol without intravenous contrast. Multiplanar CT image reconstructions of the cervical spine were also generated. COMPARISON:  03/06/2017 FINDINGS: CT HEAD FINDINGS Brain: There is no mass effect, midline shift, or acute intracranial hemorrhage. Brain parenchyma and ventricular system are unremarkable. Vascular: No hyperdense vessel or unexpected calcification. Skull:  Cranium is intact. Sinuses/Orbits: Mastoid air cells and visualized paranasal sinuses are clear. Other: Soft tissue injury over the vertex is noted. CT CERVICAL SPINE FINDINGS Alignment: Anatomic Skull base and vertebrae: No acute fracture or dislocation. Anterior plate and screws are present at C5 and C6 for interbody fusion. There is solid fusion across the disc space. No breakage or loosening of the hardware. Soft tissues and spinal canal: No obvious soft tissue hematoma. No obvious spinal hematoma. Disc levels:  No obvious disc herniation or spinal stenosis. Upper chest: Small left pneumothorax. Other: Noncontributory. IMPRESSION: No acute intracranial pathology. No evidence of cervical spine injury. C5-6 anterior interbody fusion is intact. Left pneumothorax. Electronically Signed   By: Marybelle Killings M.D.   On: 07/18/2017 18:56   Ct Chest W Contrast  Result Date: 07/18/2017 CLINICAL DATA:  Trauma with evidence of small left pneumothorax and left-sided rib and clavicle fractures by chest x-ray. EXAM: CT CHEST, ABDOMEN, AND PELVIS WITH CONTRAST TECHNIQUE: Multidetector CT imaging of the chest, abdomen and pelvis was performed following the standard protocol during bolus administration of intravenous contrast. CONTRAST:  177mL ISOVUE-300 IOPAMIDOL (ISOVUE-300) INJECTION 61% COMPARISON:  Chest x-ray earlier today. FINDINGS: CT CHEST FINDINGS Cardiovascular: No evidence of mediastinal hemorrhage. The thoracic aorta appears normal. The heart size is normal. No pericardial fluid. Mediastinum/Nodes: No enlarged mediastinal, hilar, or axillary lymph nodes. Thyroid gland, trachea, and esophagus demonstrate no significant findings. Lungs/Pleura: Anterior pneumothorax on the left of approximately 10% volume. There is atelectasis in the left lower lung. Musculoskeletal: Nondisplaced medial left first rib fracture. Mildly displaced lateral left fourth rib fracture. Nondisplaced lateral left fifth and sixth rib fractures.  Subcutaneous hemorrhage noted in the medial left upper pectoral region. The left clavicle is incompletely imaged in the left clavicular fracture seen by chest and shoulder x-ray is not assessed. CT ABDOMEN PELVIS FINDINGS Hepatobiliary: No hepatic injury or perihepatic hematoma. Status post cholecystectomy. No biliary ductal dilatation identified. Pancreas: Unremarkable. No pancreatic ductal dilatation or surrounding inflammatory changes. Spleen: No splenic injury or perisplenic hematoma. Adrenals/Urinary Tract: No adrenal hemorrhage or renal injury identified. Bladder is unremarkable. Stomach/Bowel: Bowel is unremarkable and shows no evidence of dilatation or thickening. No free air or free fluid. No abscess. Vascular/Lymphatic: No significant vascular findings are present. No enlarged abdominal or pelvic lymph nodes. Reproductive: Prostate is unremarkable. Other: No abdominal wall hernia or abnormality. No abdominopelvic ascites. Musculoskeletal: No acute or significant osseous findings. IMPRESSION: 1. Left pneumothorax of approximately 10% volume with associated left lower lung atelectasis. Nondisplaced left first, fifth and sixth rib fractures. Mildly displaced left fourth rib fracture. 2. Some subcutaneous chest wall hemorrhage is noted in the upper left medial pectoral region. 3. No acute intra-abdominal or pelvic injuries. Electronically Signed   By: Aletta Edouard M.D.   On: 07/18/2017 19:07   Ct Cervical Spine Wo Contrast  Result Date: 07/18/2017 CLINICAL DATA:  Trauma EXAM: CT HEAD WITHOUT CONTRAST CT CERVICAL SPINE WITHOUT CONTRAST TECHNIQUE: Multidetector CT imaging of the head and cervical spine was performed following the standard protocol without intravenous contrast. Multiplanar  CT image reconstructions of the cervical spine were also generated. COMPARISON:  03/06/2017 FINDINGS: CT HEAD FINDINGS Brain: There is no mass effect, midline shift, or acute intracranial hemorrhage. Brain parenchyma and  ventricular system are unremarkable. Vascular: No hyperdense vessel or unexpected calcification. Skull: Cranium is intact. Sinuses/Orbits: Mastoid air cells and visualized paranasal sinuses are clear. Other: Soft tissue injury over the vertex is noted. CT CERVICAL SPINE FINDINGS Alignment: Anatomic Skull base and vertebrae: No acute fracture or dislocation. Anterior plate and screws are present at C5 and C6 for interbody fusion. There is solid fusion across the disc space. No breakage or loosening of the hardware. Soft tissues and spinal canal: No obvious soft tissue hematoma. No obvious spinal hematoma. Disc levels:  No obvious disc herniation or spinal stenosis. Upper chest: Small left pneumothorax. Other: Noncontributory. IMPRESSION: No acute intracranial pathology. No evidence of cervical spine injury. C5-6 anterior interbody fusion is intact. Left pneumothorax. Electronically Signed   By: Marybelle Killings M.D.   On: 07/18/2017 18:56   Ct Abdomen Pelvis W Contrast  Result Date: 07/18/2017 CLINICAL DATA:  Trauma with evidence of small left pneumothorax and left-sided rib and clavicle fractures by chest x-ray. EXAM: CT CHEST, ABDOMEN, AND PELVIS WITH CONTRAST TECHNIQUE: Multidetector CT imaging of the chest, abdomen and pelvis was performed following the standard protocol during bolus administration of intravenous contrast. CONTRAST:  137mL ISOVUE-300 IOPAMIDOL (ISOVUE-300) INJECTION 61% COMPARISON:  Chest x-ray earlier today. FINDINGS: CT CHEST FINDINGS Cardiovascular: No evidence of mediastinal hemorrhage. The thoracic aorta appears normal. The heart size is normal. No pericardial fluid. Mediastinum/Nodes: No enlarged mediastinal, hilar, or axillary lymph nodes. Thyroid gland, trachea, and esophagus demonstrate no significant findings. Lungs/Pleura: Anterior pneumothorax on the left of approximately 10% volume. There is atelectasis in the left lower lung. Musculoskeletal: Nondisplaced medial left first rib  fracture. Mildly displaced lateral left fourth rib fracture. Nondisplaced lateral left fifth and sixth rib fractures. Subcutaneous hemorrhage noted in the medial left upper pectoral region. The left clavicle is incompletely imaged in the left clavicular fracture seen by chest and shoulder x-ray is not assessed. CT ABDOMEN PELVIS FINDINGS Hepatobiliary: No hepatic injury or perihepatic hematoma. Status post cholecystectomy. No biliary ductal dilatation identified. Pancreas: Unremarkable. No pancreatic ductal dilatation or surrounding inflammatory changes. Spleen: No splenic injury or perisplenic hematoma. Adrenals/Urinary Tract: No adrenal hemorrhage or renal injury identified. Bladder is unremarkable. Stomach/Bowel: Bowel is unremarkable and shows no evidence of dilatation or thickening. No free air or free fluid. No abscess. Vascular/Lymphatic: No significant vascular findings are present. No enlarged abdominal or pelvic lymph nodes. Reproductive: Prostate is unremarkable. Other: No abdominal wall hernia or abnormality. No abdominopelvic ascites. Musculoskeletal: No acute or significant osseous findings. IMPRESSION: 1. Left pneumothorax of approximately 10% volume with associated left lower lung atelectasis. Nondisplaced left first, fifth and sixth rib fractures. Mildly displaced left fourth rib fracture. 2. Some subcutaneous chest wall hemorrhage is noted in the upper left medial pectoral region. 3. No acute intra-abdominal or pelvic injuries. Electronically Signed   By: Aletta Edouard M.D.   On: 07/18/2017 19:07   Dg Chest Port 1 View  Result Date: 07/19/2017 CLINICAL DATA:  Trauma; patient hit by a tree EXAM: PORTABLE CHEST 1 VIEW COMPARISON:  Chest radiograph and chest CT July 18, 2017 FINDINGS: The left-sided pneumothorax noted on CT 1 day prior is not appreciable by radiography. There are several rib fractures noted on the left. There is left base and left perihilar atelectasis. Lungs elsewhere are  clear. Heart  size and pulmonary vascularity are within normal limits. No adenopathy. Postoperative changes noted in the lower cervical spine. There is a displaced fracture of the left clavicle at the junction of the mid and distal thirds. IMPRESSION: Multiple fractures on the left. Pneumothorax seen on recent CT is not evident on portable chest radiographic examination. There is left base and left perihilar atelectasis. Lungs elsewhere clear. Cardiac silhouette within normal limits. Electronically Signed   By: Lowella Grip III M.D.   On: 07/19/2017 08:06   Dg Chest Portable 1 View  Result Date: 07/18/2017 CLINICAL DATA:  Struck by tree EXAM: PORTABLE CHEST 1 VIEW COMPARISON:  03/25/2015 FINDINGS: Upper normal heart size. Low volumes. Bibasilar atelectasis. Aortic knob is indistinct. Left apical pleural thickening. Acute fractures of the left first and fourth ribs. Left clavicle fracture with displacement. Tiny left apical pneumothorax. IMPRESSION: Bibasilar atelectasis. Aortic knob is indistinct.  Mediastinal hemorrhage not excluded. Tiny left pneumothorax. Left first and fourth rib fractures. Left apical pleural thickening. Electronically Signed   By: Marybelle Killings M.D.   On: 07/18/2017 19:05   Dg Cerv Spine Flex&ext Only  Result Date: 07/18/2017 CLINICAL DATA:  Pt. [place] out of deer stand tonight. No neck pain.Left sided rib and clavicle fractures and pneumothoraxPt. Unable to relax shoulders down due to fractures and chest pain EXAM: CERVICAL SPINE - FLEXION AND EXTENSION VIEWS ONLY COMPARISON:  Current cervical CT. FINDINGS: The superimposed shoulders obscures the the lower cervical spine, from C6 inferiorly. There is no fracture. There is no spondylolisthesis. The visualized anterior fusion plate at Z1-I4 appears well-seated. There is no subluxation with flexion or extension, and no movement of the orthopedic hardware. Soft tissues are unremarkable. IMPRESSION: 1. No fracture. No subluxation with  flexion or extension. Lower cervical spine not visualized due to overlying soft tissues. Electronically Signed   By: Lajean Manes M.D.   On: 07/18/2017 21:07   Dg Shoulder Left  Result Date: 07/18/2017 CLINICAL DATA:  Fall from tree EXAM: LEFT SHOULDER - 2+ VIEW COMPARISON:  None. FINDINGS: There is a displaced fracture of the midclavicle. Multiple lateral left mid and upper rib fractures are suspected. Humerus is intact. Glenohumeral joint is anatomic. IMPRESSION: Possible left rib fractures. Mid clavicle fracture. Electronically Signed   By: Marybelle Killings M.D.   On: 07/18/2017 17:58   Dg Knee Complete 4 Views Left  Result Date: 07/18/2017 CLINICAL DATA:  Fall from tree stand with multiple injuries. Initial encounter. EXAM: LEFT KNEE - COMPLETE 4+ VIEW COMPARISON:  None. FINDINGS: No evidence of fracture, dislocation, or joint effusion. There is some potential soft tissue thickening involving the distal aspect of the quadriceps tendon just superior to the patella. Correlation suggested with any clinical evidence of tendon injury. No soft tissue foreign body. IMPRESSION: No evidence of fracture. Potential thickening/injury of the distal quadriceps tendon. Electronically Signed   By: Aletta Edouard M.D.   On: 07/18/2017 18:00   Dg Knee Complete 4 Views Right  Result Date: 07/18/2017 CLINICAL DATA:  Fall from a tree stand today. Pt is unsure if he lost consciousness or if the tree stand broke and fell. Pt c/o all over left shoulder pain and has no ROM in the left shoulder. EXAM: RIGHT KNEE - COMPLETE 4+ VIEW COMPARISON:  None. FINDINGS: There is no acute fracture or subluxation. No joint effusion. Along the posterior distal aspect of the femur, there is focal portable thickening, favored to represent a remote fibrous cortical defect. IMPRESSION: 1.  No evidence for acute  abnormality. 2. Probably benign fibrous cortical defect along the distal posterior aspect of the femur. In the absence of known  malignancy, no additional evaluation is needed. Electronically Signed   By: Nolon Nations M.D.   On: 07/18/2017 18:07   Dg Humerus Left  Result Date: 07/18/2017 CLINICAL DATA:  Fall from tree EXAM: LEFT HUMERUS - 2+ VIEW COMPARISON:  None. FINDINGS: No acute fracture. No dislocation.  Unremarkable soft tissues. IMPRESSION: No acute bony pathology. Electronically Signed   By: Marybelle Killings M.D.   On: 07/18/2017 17:58   Dg Hand Complete Right  Result Date: 07/18/2017 CLINICAL DATA:  Trauma.  Fall. EXAM: RIGHT HAND - COMPLETE 3+ VIEW COMPARISON:  None. FINDINGS: No acute fracture. No acute dislocation. Unremarkable soft tissues. IMPRESSION: No acute bony pathology. Electronically Signed   By: Marybelle Killings M.D.   On: 07/18/2017 18:03    Anti-infectives: Anti-infectives    Start     Dose/Rate Route Frequency Ordered Stop   07/18/17 2200  ceFAZolin (ANCEF) IVPB 1 g/50 mL premix     1 g 100 mL/hr over 30 Minutes Intravenous Every 8 hours 07/18/17 1902 07/19/17 2141       Assessment/Plan Fall from a height  Mid scalp degloving laceration - repaired in ED with staples, will need staple removal in 7-10 days - bacitracin BID Left rib 1,4-5 fractures with small left ptx - CXR: rib fxs, no ptx - IS, pulm toilet Left clavicle fracture  - ortho consulted - sling for comfort; NWB on LUE until evaluated by ortho - PT recommending CIR, will consult Neck pain - acute on chronic - C spine cleared  Chronic low back pain CML  FEN - regular diet VTE - SCDs, lovenox ID - ancef discontinued; bacitracin to scalp laceration BID Pain: IM toradol once; scheduled robaxin and tylenol; Oxy 10-20 mg PO q4h prn; ibuprofen 600 mg PO q6h prn  Dispo: Ortho to see for left clavicle. PT/OT. Pain control.   LOS: 2 days    Brigid Re , [facility] 07/20/2017, 7:36 AM Pager: [phone] Trauma Pager: [phone] Mon-Fri 7:00 am-4:30 pm Sat-Sun 7:00 am-11:30 am

## 2017-07-20 NOTE — Evaluation (Signed)
Occupational Therapy Evaluation and Discharge Patient Details Name: Cody Nunez MRN: [id number] DOB: 1971-04-04 Today's Date: 07/20/2017    History of Present Illness Patient is a 46 y/o male with PMH of CML, chronic pain in neck and back who apparently fell from a deer stand with scalp laceration, L rib fx's 1, 4-5 and  L clavicle fx.--surgery next week.   Clinical Impression   This 46 yo male admitted with above presents to acute OT with all education completed, we will D/C from acute OT.     Follow Up Recommendations  No OT follow up;Supervision - Intermittent    Equipment Recommendations  None recommended by OT       Precautions / Restrictions Precautions Precautions: Fall Required Braces or Orthoses: Sling Restrictions Weight Bearing Restrictions: Yes LUE Weight Bearing: Non weight bearing      Mobility Bed Mobility Overal bed mobility: Modified Independent Bed Mobility: ;Sit to Supine     Sit to supine: Min assist;HOB elevated    Transfers Overall transfer level: Modified independent Equipment used: None Transfers: Sit to/from Stand Sit to Stand: Modified independent (Device/Increase time)         General transfer comment: increased time and need for UE support    Balance           Standing balance support: No upper extremity supported Standing balance-Leahy Scale: Good                             ADL either performed or assessed with clinical judgement   ADL Overall ADL's : Modified independent                                       General ADL Comments: Educated him on way he could wash under arm using gravity as well as using baking soda for deodorant using gravity to bring his arm away from his body to apply soda with his right hand under his left arm. Wearing shirt over sling for LUE put puttiing RUE through arm of shirt     Vision Patient Visual Report: No change from baseline              Pertinent  Vitals/Pain Pain Assessment: 0-10 Pain Score: 6  Pain Location: L clavicle, ribs Pain Descriptors / Indicators: Grimacing;Guarding Pain Intervention(s): Monitored during session;Repositioned     Hand Dominance   right  Extremity/Trunk Assessment Upper Extremity Assessment LUE Deficits / Details: in sling (clavicle fx), able to use hand  LUE Coordination: decreased gross motor           Communication Communication Communication: No difficulties   Cognition Arousal/Alertness: Awake/alert Behavior During Therapy: WFL for tasks assessed/performed Overall Cognitive Status: Within Functional Limits for tasks assessed                                     General Comments  able to use incentive spirometer up to 1700 today; discussed managing mobility around pain medication.  He seems concerned about getting meds as outpatient; noted plans for return for surgery next week for L clavicle fx            Home Living Family/patient expects to be discharged to:: Private residence Living Arrangements: Parent Available Help at Discharge: Family Type of Home:  House Home Access: Level entry     Home Layout: One level     Bathroom Shower/Tub: Teacher, early years/pre: Standard     Home Equipment: Hand held shower head          Prior Functioning/Environment Level of Independence: Independent                          OT Goals(Current goals can be found in the care plan section) Acute Rehab OT Goals Patient Stated Goal: to get left shoulder surgery  OT Frequency:                AM-PAC PT "6 Clicks" Daily Activity     Outcome Measure Help from another person eating meals?: None Help from another person taking care of personal grooming?: None Help from another person toileting, which includes using toliet, bedpan, or urinal?: None Help from another person bathing (including washing, rinsing, drying)?: None Help from another person to put  on and taking off regular upper body clothing?: None Help from another person to put on and taking off regular lower body clothing?: None 6 Click Score: 24   End of Session    Activity Tolerance: Patient tolerated treatment well Patient left: in bed;with call bell/phone within reach                   Time: 1309-1320 OT Time Calculation (min): 11 min Charges:  OT General Charges $OT Visit: 1 Procedure OT Evaluation $OT Eval Low Complexity: 1 Procedure [place], OTR/L [phone] 07/20/2017

## 2017-07-20 NOTE — Consult Note (Signed)
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 reasonable.  Plan for ORIF on Tuesday  Patient can go home in the interim and return on Tuesday morning for surgery. This will be done as an outpatient surgery  Continue with sling for comfort  Ice for swelling and pain control  Okay to come out of the sling periodically to move elbow.   can use left arm as pain allows  - Pain management  Reviewed [facility] controlled substance reporting system.. Patients only opioid prescription was for oxycodone 10 mg 1 by mouth every 8 hours as needed prior to admission   Agree with current regimen. Current regimen should address his chronic pain as well as his acute pain.   His prior to admission medication list notes oxycodone 30 mg 1 every 8 hours. I did a medication query for an entire year and did not see this medication.  - Medical issues  Per TS  - DVT/PE prophylaxis:  Lovenox  - Dispo:  Ok to [employer] home over weekend if appropriate  Pt is already posted for 07/24/2017 at [place], PA-C [facility] [phone]  (P) 07/20/2017, 1:32 PM
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
## 2017-07-20 NOTE — Progress Notes (Signed)
Inpatient Rehabilitation  Please refer to Dr. Serita Grit consult for full details.  At present patient unable to participate in or tolerate 3 hours of therapy a day.  He anticipates that once pain is managed patient will do well functionally and be able to go home with family support.  Met with patient to discuss and is in agreement that he cannot currently do the therapy.  If patient remains in house over the weekend I will follow up Monday.    Carmelia Roller., CCC/SLP Admission Coordinator  [place]  Cell [phone]

## 2017-07-20 NOTE — Progress Notes (Addendum)
    Durable Medical Equipment        Start     Ordered   07/20/17 1535  For home use only DME Hospital bed  Once    Question Answer Comment  Patient has (list medical condition): left clavicle fracture, left rib fractures, chronic neck/back pain   The above medical condition requires: Patient requires the ability to reposition frequently   Bed type Semi-electric      07/20/17 1535       .Reinaldo Raddle, RN, BSN  Trauma/Neuro ICU Case Manager [phone]

## 2017-07-23 ENCOUNTER — Telehealth (HOSPITAL_COMMUNITY): Payer: Self-pay

## 2017-07-23 ENCOUNTER — Encounter (HOSPITAL_COMMUNITY): Payer: Self-pay | Admitting: Vascular Surgery

## 2017-07-23 MED ORDER — DEXTROSE 5 % IV SOLN
3.0000 g | INTRAVENOUS | Status: AC
  Administered 2017-07-24: 3 g via INTRAVENOUS
  Filled 2017-07-23: qty 3

## 2017-07-23 NOTE — Telephone Encounter (Signed)
Called pt and informed him of pre-scheduled surgery for clavicle tomorrow at 1220. Provided him with Dr. Carlean Jews office phone number to receive further pre-op information.

## 2017-07-23 NOTE — Progress Notes (Signed)
Pt denies any acute cardiopulmonary problems. Pt denies having a stress test, echo and cardiac cath. Pt not currently taking Xarelto. Please complete pt assessments DOS; pre-op instructions given only. Pt made aware to stop taking vitamins, fish oil and herbal medications. Do not take any NSAIDs ie: Ibuprofen, Advil, Naproxen (Aleve), Motrin, BC and Goody Powder. Pt verbalized understanding of all pre-op instructions.

## 2017-07-23 NOTE — Telephone Encounter (Signed)
[phone] he says his arm and hand has gone numb and thinks he is to have surgery, but not sure when.  Says he is in a lot of pain. Please call
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
## 2017-07-24 ENCOUNTER — Encounter (HOSPITAL_COMMUNITY)
Admission: RE | Disposition: A | Discharge status: Home / Self Care | Payer: Self-pay | Source: Ambulatory Visit | Attending: Orthopedic Surgery

## 2017-07-24 ENCOUNTER — Inpatient Hospital Stay (HOSPITAL_COMMUNITY): Payer: Medicaid Other

## 2017-07-24 ENCOUNTER — Ambulatory Visit (HOSPITAL_COMMUNITY)
Admission: RE | Admit: 2017-07-24 | Discharge: 2017-07-25 | Disposition: A | Discharge status: Home / Self Care | Payer: Medicaid Other | Source: Ambulatory Visit | Attending: Orthopedic Surgery | Admitting: Orthopedic Surgery

## 2017-07-24 ENCOUNTER — Inpatient Hospital Stay (HOSPITAL_COMMUNITY): Payer: Medicaid Other | Admitting: Vascular Surgery

## 2017-07-24 ENCOUNTER — Encounter (HOSPITAL_COMMUNITY): Payer: Self-pay | Admitting: Anesthesiology

## 2017-07-24 DIAGNOSIS — S42022A Displaced fracture of shaft of left clavicle, initial encounter for closed fracture: Principal | ICD-10-CM | POA: Insufficient documentation

## 2017-07-24 DIAGNOSIS — S42002A Fracture of unspecified part of left clavicle, initial encounter for closed fracture: Secondary | ICD-10-CM | POA: Diagnosis present

## 2017-07-24 DIAGNOSIS — Z7901 Long term (current) use of anticoagulants: Secondary | ICD-10-CM | POA: Insufficient documentation

## 2017-07-24 DIAGNOSIS — Z79899 Other long term (current) drug therapy: Secondary | ICD-10-CM | POA: Insufficient documentation

## 2017-07-24 DIAGNOSIS — S2249XA Multiple fractures of ribs, unspecified side, initial encounter for closed fracture: Secondary | ICD-10-CM | POA: Diagnosis present

## 2017-07-24 DIAGNOSIS — E669 Obesity, unspecified: Secondary | ICD-10-CM | POA: Diagnosis not present

## 2017-07-24 DIAGNOSIS — S2242XA Multiple fractures of ribs, left side, initial encounter for closed fracture: Secondary | ICD-10-CM | POA: Diagnosis not present

## 2017-07-24 DIAGNOSIS — G894 Chronic pain syndrome: Secondary | ICD-10-CM | POA: Diagnosis not present

## 2017-07-24 DIAGNOSIS — I10 Essential (primary) hypertension: Secondary | ICD-10-CM | POA: Diagnosis present

## 2017-07-24 DIAGNOSIS — S270XXA Traumatic pneumothorax, initial encounter: Secondary | ICD-10-CM | POA: Insufficient documentation

## 2017-07-24 DIAGNOSIS — T148XXA Other injury of unspecified body region, initial encounter: Secondary | ICD-10-CM

## 2017-07-24 DIAGNOSIS — Z7982 Long term (current) use of aspirin: Secondary | ICD-10-CM | POA: Diagnosis not present

## 2017-07-24 DIAGNOSIS — Z419 Encounter for procedure for purposes other than remedying health state, unspecified: Secondary | ICD-10-CM

## 2017-07-24 DIAGNOSIS — X58XXXA Exposure to other specified factors, initial encounter: Secondary | ICD-10-CM | POA: Diagnosis not present

## 2017-07-24 DIAGNOSIS — Z8673 Personal history of transient ischemic attack (TIA), and cerebral infarction without residual deficits: Secondary | ICD-10-CM | POA: Diagnosis not present

## 2017-07-24 DIAGNOSIS — Z8709 Personal history of other diseases of the respiratory system: Secondary | ICD-10-CM

## 2017-07-24 HISTORY — DX: Adverse effect of unspecified anesthetic, initial encounter: T41.45XA

## 2017-07-24 HISTORY — PX: ORIF CLAVICULAR FRACTURE: SHX5055

## 2017-07-24 HISTORY — DX: Other complications of anesthesia, initial encounter: T88.59XA

## 2017-07-24 HISTORY — DX: Coagulation defect, unspecified: D68.9

## 2017-07-24 LAB — COMPREHENSIVE METABOLIC PANEL
ALBUMIN: 3.5 g/dL (ref 3.5–5.0)
ALK PHOS: 196 U/L — AB (ref 38–126)
ALT: 32 U/L (ref 17–63)
ANION GAP: 6 (ref 5–15)
AST: 38 U/L (ref 15–41)
BUN: 11 mg/dL (ref 6–20)
CALCIUM: 8.8 mg/dL — AB (ref 8.9–10.3)
CO2: 27 mmol/L (ref 22–32)
Chloride: 103 mmol/L (ref 101–111)
Creatinine, Ser: 0.75 mg/dL (ref 0.61–1.24)
GFR calc Af Amer: >60 mL/min (ref >60)
GFR calc non Af Amer: >60 mL/min (ref >60)
GLUCOSE: 115 mg/dL — AB (ref 65–99)
Potassium: 4.1 mmol/L (ref 3.5–5.1)
SODIUM: 136 mmol/L (ref 135–145)
Total Bilirubin: 0.8 mg/dL (ref 0.3–1.2)
Total Protein: 7 g/dL (ref 6.5–8.1)

## 2017-07-24 LAB — CBC WITH DIFFERENTIAL/PLATELET
Basophils Absolute: 0 10*3/uL (ref 0.0–0.1)
Basophils Relative: 0 %
Eosinophils Absolute: 0.4 10*3/uL (ref 0.0–0.7)
Eosinophils Relative: 4 %
HEMATOCRIT: 39.4 % (ref 39.0–52.0)
HEMOGLOBIN: 13.1 g/dL (ref 13.0–17.0)
LYMPHS ABS: 2.3 10*3/uL (ref 0.7–4.0)
Lymphocytes Relative: 25 %
MCH: 29.6 pg (ref 26.0–34.0)
MCHC: 33.2 g/dL (ref 30.0–36.0)
MCV: 89.1 fL (ref 78.0–100.0)
MONO ABS: 0.8 10*3/uL (ref 0.1–1.0)
MONOS PCT: 9 %
NEUTROS ABS: 6 10*3/uL (ref 1.7–7.7)
NEUTROS PCT: 62 %
Platelets: 242 10*3/uL (ref 150–400)
RBC: 4.42 MIL/uL (ref 4.22–5.81)
RDW: 14.1 % (ref 11.5–15.5)
WBC: 9.5 10*3/uL (ref 4.0–10.5)

## 2017-07-24 LAB — APTT: APTT: 30 s (ref 24–36)

## 2017-07-24 LAB — PROTIME-INR
INR: 1
Prothrombin Time: 13.2 seconds (ref 11.4–15.2)

## 2017-07-24 SURGERY — OPEN REDUCTION INTERNAL FIXATION (ORIF) CLAVICULAR FRACTURE
Anesthesia: General | Site: Shoulder | Laterality: Left

## 2017-07-24 MED ORDER — METHOCARBAMOL 500 MG PO TABS: 500.0000 mg | ORAL_TABLET | Freq: Four times a day (QID) | ORAL | 0 refills | Status: DC | PRN

## 2017-07-24 MED ORDER — ONDANSETRON HCL 4 MG/2ML IJ SOLN
INTRAMUSCULAR | Status: DC | PRN
  Administered 2017-07-24: 4 mg via INTRAVENOUS

## 2017-07-24 MED ORDER — OXYCODONE HCL 10 MG PO TABS: 10.0000 mg | ORAL_TABLET | ORAL | 0 refills | Status: DC | PRN

## 2017-07-24 MED ORDER — CEFAZOLIN SODIUM-DEXTROSE 1-4 GM/50ML-% IV SOLN
1.0000 g | Freq: Four times a day (QID) | INTRAVENOUS | Status: DC
  Administered 2017-07-24 – 2017-07-25 (×2): 1 g via INTRAVENOUS
  Filled 2017-07-24 (×3): qty 50

## 2017-07-24 MED ORDER — ONDANSETRON HCL 4 MG/2ML IJ SOLN
4.0000 mg | Freq: Four times a day (QID) | INTRAMUSCULAR | Status: DC | PRN
  Administered 2017-07-24: 4 mg via INTRAVENOUS

## 2017-07-24 MED ORDER — FENTANYL CITRATE (PF) 100 MCG/2ML IJ SOLN
25.0000 ug | INTRAMUSCULAR | Status: DC | PRN
  Administered 2017-07-24: 50 ug via INTRAVENOUS
  Administered 2017-07-24 (×2): 25 ug via INTRAVENOUS
  Administered 2017-07-24: 50 ug via INTRAVENOUS

## 2017-07-24 MED ORDER — FENTANYL CITRATE (PF) 100 MCG/2ML IJ SOLN
INTRAMUSCULAR | Status: AC
  Administered 2017-07-24: 25 ug via INTRAVENOUS
  Filled 2017-07-24: qty 2

## 2017-07-24 MED ORDER — FENTANYL CITRATE (PF) 100 MCG/2ML IJ SOLN
25.0000 ug | Freq: Once | INTRAMUSCULAR | Status: AC
  Administered 2017-07-24: 25 ug via INTRAVENOUS

## 2017-07-24 MED ORDER — ROCURONIUM BROMIDE 100 MG/10ML IV SOLN
INTRAVENOUS | Status: DC | PRN
  Administered 2017-07-24 (×2): 50 mg via INTRAVENOUS

## 2017-07-24 MED ORDER — MIDAZOLAM HCL 5 MG/5ML IJ SOLN
INTRAMUSCULAR | Status: DC | PRN
  Administered 2017-07-24: 2 mg via INTRAVENOUS

## 2017-07-24 MED ORDER — FENTANYL CITRATE (PF) 250 MCG/5ML IJ SOLN
INTRAMUSCULAR | Status: AC
  Filled 2017-07-24: qty 5

## 2017-07-24 MED ORDER — ONDANSETRON HCL 4 MG PO TABS: 4.0000 mg | ORAL_TABLET | Freq: Four times a day (QID) | ORAL | Status: DC | PRN

## 2017-07-24 MED ORDER — SUGAMMADEX SODIUM 200 MG/2ML IV SOLN
INTRAVENOUS | Status: DC | PRN
  Administered 2017-07-24: 200 mg via INTRAVENOUS

## 2017-07-24 MED ORDER — METHOCARBAMOL 1000 MG/10ML IJ SOLN
1000.0000 mg | INTRAVENOUS | Status: AC
  Administered 2017-07-24: 1000 mg via INTRAVENOUS
  Filled 2017-07-24: qty 10

## 2017-07-24 MED ORDER — TRANEXAMIC ACID 1000 MG/10ML IV SOLN
2000.0000 mg | Freq: Once | INTRAVENOUS | Status: AC
  Administered 2017-07-24: 2000 mg via TOPICAL
  Filled 2017-07-24: qty 20

## 2017-07-24 MED ORDER — HYDROMORPHONE HCL 1 MG/ML IJ SOLN
INTRAMUSCULAR | Status: AC
  Filled 2017-07-24: qty 0.5

## 2017-07-24 MED ORDER — LIDOCAINE HCL (CARDIAC) 20 MG/ML IV SOLN
INTRAVENOUS | Status: DC | PRN
  Administered 2017-07-24: 80 mg via INTRAVENOUS

## 2017-07-24 MED ORDER — LOSARTAN POTASSIUM 50 MG PO TABS
50.0000 mg | ORAL_TABLET | Freq: Every day | ORAL | Status: DC
  Filled 2017-07-24: qty 1

## 2017-07-24 MED ORDER — ACETAMINOPHEN 500 MG PO TABS
ORAL_TABLET | ORAL | Status: AC
  Filled 2017-07-24: qty 2

## 2017-07-24 MED ORDER — LIDOCAINE 2% (20 MG/ML) 5 ML SYRINGE
INTRAMUSCULAR | Status: AC
  Filled 2017-07-24: qty 5

## 2017-07-24 MED ORDER — OXYCODONE HCL 5 MG PO TABS
ORAL_TABLET | ORAL | Status: AC
  Filled 2017-07-24: qty 4

## 2017-07-24 MED ORDER — FENTANYL CITRATE (PF) 100 MCG/2ML IJ SOLN
INTRAMUSCULAR | Status: DC | PRN
  Administered 2017-07-24 (×2): 50 ug via INTRAVENOUS
  Administered 2017-07-24: 100 ug via INTRAVENOUS
  Administered 2017-07-24 (×6): 50 ug via INTRAVENOUS

## 2017-07-24 MED ORDER — FENTANYL CITRATE (PF) 100 MCG/2ML IJ SOLN
25.0000 ug | Freq: Once | INTRAMUSCULAR | Status: AC
  Administered 2017-07-24: 25 ug via INTRAVENOUS
  Filled 2017-07-24: qty 2

## 2017-07-24 MED ORDER — POTASSIUM CHLORIDE IN NACL 20-0.9 MEQ/L-% IV SOLN
INTRAVENOUS | Status: DC
  Administered 2017-07-24: 20:00:00 via INTRAVENOUS
  Filled 2017-07-24: qty 1000

## 2017-07-24 MED ORDER — HYDROCHLOROTHIAZIDE 25 MG PO TABS
12.5000 mg | ORAL_TABLET | Freq: Every day | ORAL | Status: DC
  Filled 2017-07-24: qty 1

## 2017-07-24 MED ORDER — PROMETHAZINE HCL 25 MG/ML IJ SOLN: 6.2500 mg | INTRAMUSCULAR | Status: DC | PRN

## 2017-07-24 MED ORDER — ACETAMINOPHEN 500 MG PO TABS: 500.0000 mg | ORAL_TABLET | Freq: Four times a day (QID) | ORAL | Status: DC | PRN

## 2017-07-24 MED ORDER — OXYCODONE HCL 5 MG PO TABS
10.0000 mg | ORAL_TABLET | ORAL | Status: DC | PRN
  Administered 2017-07-24 – 2017-07-25 (×2): 20 mg via ORAL
  Filled 2017-07-24 (×2): qty 4

## 2017-07-24 MED ORDER — ONDANSETRON HCL 4 MG/2ML IJ SOLN
INTRAMUSCULAR | Status: AC
  Filled 2017-07-24: qty 2

## 2017-07-24 MED ORDER — ROCURONIUM BROMIDE 10 MG/ML (PF) SYRINGE
PREFILLED_SYRINGE | INTRAVENOUS | Status: AC
  Filled 2017-07-24: qty 10

## 2017-07-24 MED ORDER — BUPIVACAINE LIPOSOME 1.3 % IJ SUSP
20.0000 mL | INTRAMUSCULAR | Status: AC
  Administered 2017-07-24: 20 mL
  Filled 2017-07-24: qty 20

## 2017-07-24 MED ORDER — FENTANYL CITRATE (PF) 100 MCG/2ML IJ SOLN
INTRAMUSCULAR | Status: AC
  Filled 2017-07-24: qty 2

## 2017-07-24 MED ORDER — 0.9 % SODIUM CHLORIDE (POUR BTL) OPTIME
TOPICAL | Status: DC | PRN
  Administered 2017-07-24: 1000 mL

## 2017-07-24 MED ORDER — MIDAZOLAM HCL 2 MG/2ML IJ SOLN
INTRAMUSCULAR | Status: AC
  Filled 2017-07-24: qty 2

## 2017-07-24 MED ORDER — PROPOFOL 10 MG/ML IV BOLUS
INTRAVENOUS | Status: DC | PRN
  Administered 2017-07-24: 200 mg via INTRAVENOUS

## 2017-07-24 MED ORDER — LACTATED RINGERS IV SOLN
INTRAVENOUS | Status: DC
  Administered 2017-07-24 (×2): via INTRAVENOUS

## 2017-07-24 MED ORDER — HYDROMORPHONE HCL 1 MG/ML IJ SOLN
1.0000 mg | INTRAMUSCULAR | Status: DC | PRN
  Administered 2017-07-24 – 2017-07-25 (×4): 2 mg via INTRAVENOUS
  Filled 2017-07-24 (×4): qty 2

## 2017-07-24 MED ORDER — HYDROMORPHONE HCL 1 MG/ML IJ SOLN
INTRAMUSCULAR | Status: DC | PRN
  Administered 2017-07-24 (×2): 0.5 mg via INTRAVENOUS

## 2017-07-24 MED ORDER — METOCLOPRAMIDE HCL 5 MG/ML IJ SOLN: 5.0000 mg | Freq: Three times a day (TID) | INTRAMUSCULAR | Status: DC | PRN

## 2017-07-24 MED ORDER — FENTANYL CITRATE (PF) 100 MCG/2ML IJ SOLN: 25.0000 ug | INTRAMUSCULAR | Status: DC | PRN

## 2017-07-24 MED ORDER — METOCLOPRAMIDE HCL 5 MG PO TABS: 5.0000 mg | ORAL_TABLET | Freq: Three times a day (TID) | ORAL | Status: DC | PRN

## 2017-07-24 MED ORDER — METHOCARBAMOL 500 MG PO TABS
1000.0000 mg | ORAL_TABLET | Freq: Four times a day (QID) | ORAL | Status: DC
  Administered 2017-07-24: 1000 mg via ORAL
  Filled 2017-07-24: qty 2

## 2017-07-24 MED ORDER — METHOCARBAMOL 1000 MG/10ML IJ SOLN
1000.0000 mg | Freq: Four times a day (QID) | INTRAVENOUS | Status: DC
  Filled 2017-07-24 (×4): qty 10

## 2017-07-24 MED ORDER — CHLORHEXIDINE GLUCONATE 4 % EX LIQD: 60.0000 mL | Freq: Once | CUTANEOUS | Status: DC

## 2017-07-24 MED ORDER — ZOLPIDEM TARTRATE 5 MG PO TABS
10.0000 mg | ORAL_TABLET | Freq: Every day | ORAL | Status: DC
  Administered 2017-07-24: 10 mg via ORAL
  Filled 2017-07-24: qty 2

## 2017-07-24 MED ORDER — ACETAMINOPHEN 500 MG PO TABS
1000.0000 mg | ORAL_TABLET | Freq: Four times a day (QID) | ORAL | Status: DC
  Administered 2017-07-24 – 2017-07-25 (×3): 1000 mg via ORAL
  Filled 2017-07-24 (×2): qty 2

## 2017-07-24 SURGICAL SUPPLY — 64 items
APL SKNCLS STERI-STRIP NONHPOA (GAUZE/BANDAGES/DRESSINGS) ×1
BENZOIN TINCTURE PRP APPL 2/3 (GAUZE/BANDAGES/DRESSINGS) ×2 IMPLANT
BIT DRILL 2.8X5 QR DISP (BIT) ×2 IMPLANT
BNDG COHESIVE 4X5 TAN STRL (GAUZE/BANDAGES/DRESSINGS) IMPLANT
BONE CANC CHIPS 20CC PCAN1/4 (Bone Implant) ×3 IMPLANT
BRUSH SCRUB SURG 4.25 DISP (MISCELLANEOUS) ×6 IMPLANT
CANISTER SUCTION WELLS/JOHNSON (MISCELLANEOUS) ×2 IMPLANT
CHIPS CANC BONE 20CC PCAN1/4 (Bone Implant) ×1 IMPLANT
CLOSURE STERI-STRIP 1/2X4 (GAUZE/BANDAGES/DRESSINGS) ×1
CLOSURE WOUND 1/2 X4 (GAUZE/BANDAGES/DRESSINGS)
CLSR STERI-STRIP ANTIMIC 1/2X4 (GAUZE/BANDAGES/DRESSINGS) ×1 IMPLANT
COVER SURGICAL LIGHT HANDLE (MISCELLANEOUS) ×8 IMPLANT
DRAPE C-ARM 42X72 X-RAY (DRAPES) ×3 IMPLANT
DRAPE INCISE IOBAN 66X45 STRL (DRAPES) ×1 IMPLANT
DRAPE LAPAROTOMY TRNSV 102X78 (DRAPE) ×1 IMPLANT
DRAPE ORTHO SPLIT 77X108 STRL (DRAPES) ×6
DRAPE SURG ORHT 6 SPLT 77X108 (DRAPES) IMPLANT
DRAPE U-SHAPE 47X51 STRL (DRAPES) ×4 IMPLANT
DRSG MEPILEX BORDER 4X8 (GAUZE/BANDAGES/DRESSINGS) ×3 IMPLANT
ELECT REM PT RETURN 9FT ADLT (ELECTROSURGICAL) ×3
ELECTRODE REM PT RTRN 9FT ADLT (ELECTROSURGICAL) ×1 IMPLANT
GLOVE BIO SURGEON STRL SZ7.5 (GLOVE) ×3 IMPLANT
GLOVE BIO SURGEON STRL SZ8 (GLOVE) ×3 IMPLANT
GLOVE BIOGEL PI IND STRL 7.5 (GLOVE) ×1 IMPLANT
GLOVE BIOGEL PI IND STRL 8 (GLOVE) ×1 IMPLANT
GLOVE BIOGEL PI INDICATOR 7.5 (GLOVE) ×2
GLOVE BIOGEL PI INDICATOR 8 (GLOVE) ×2
GOWN STRL REUS W/ TWL LRG LVL3 (GOWN DISPOSABLE) ×2 IMPLANT
GOWN STRL REUS W/ TWL XL LVL3 (GOWN DISPOSABLE) ×1 IMPLANT
GOWN STRL REUS W/TWL LRG LVL3 (GOWN DISPOSABLE) ×12
GOWN STRL REUS W/TWL XL LVL3 (GOWN DISPOSABLE) ×3
GRAFT BNE CANC CHIPS 1-8 20CC (Bone Implant) IMPLANT
KIT BASIN OR (CUSTOM PROCEDURE TRAY) ×3 IMPLANT
KIT ROOM TURNOVER OR (KITS) ×3 IMPLANT
MANIFOLD NEPTUNE II (INSTRUMENTS) ×1 IMPLANT
NDL HYPO 25GX1X1/2 BEV (NEEDLE) IMPLANT
NEEDLE HYPO 25GX1X1/2 BEV (NEEDLE) IMPLANT
NS IRRIG 1000ML POUR BTL (IV SOLUTION) ×3 IMPLANT
PACK GENERAL/GYN (CUSTOM PROCEDURE TRAY) ×3 IMPLANT
PAD ARMBOARD 7.5X6 YLW CONV (MISCELLANEOUS) ×6 IMPLANT
PLATE CLAVICLE 10 HOLE (Plate) ×2 IMPLANT
SCREW HEXALOBE LOCKING 3.5X14M (Screw) ×6 IMPLANT
SCREW HEXALOBE LOCKING 3.5X16M (Screw) ×6 IMPLANT
SCREW HEXALOBE NON-LOCK 3.5X14 (Screw) ×2 IMPLANT
SCREW HEXALOBE NON-LOCK 3.5X16 (Screw) ×2 IMPLANT
SCREW NON LOCKING HEX 3.5X18MM (Screw) ×2 IMPLANT
SLING ARM FOAM STRAP LRG (SOFTGOODS) ×2 IMPLANT
SLING ARM IMMOBILIZER LRG (SOFTGOODS) IMPLANT
SLING ARM IMMOBILIZER MED (SOFTGOODS) IMPLANT
STAPLER VISISTAT 35W (STAPLE) ×3 IMPLANT
STOCKINETTE IMPERVIOUS 9X36 MD (GAUZE/BANDAGES/DRESSINGS) IMPLANT
STRIP CLOSURE SKIN 1/2X4 (GAUZE/BANDAGES/DRESSINGS) ×1 IMPLANT
SUCTION FRAZIER HANDLE 10FR (MISCELLANEOUS) ×2
SUCTION TUBE FRAZIER 10FR DISP (MISCELLANEOUS) ×1 IMPLANT
SUT MNCRL AB 3-0 PS2 18 (SUTURE) ×2 IMPLANT
SUT MNCRL AB 3-0 PS2 27 (SUTURE) ×3 IMPLANT
SUT VIC AB 0 CT1 27 (SUTURE) ×3
SUT VIC AB 0 CT1 27XBRD ANBCTR (SUTURE) ×1 IMPLANT
SUT VIC AB 2-0 CT1 27 (SUTURE) ×6
SUT VIC AB 2-0 CT1 TAPERPNT 27 (SUTURE) ×1 IMPLANT
SYR CONTROL 10ML LL (SYRINGE) ×2 IMPLANT
TOWEL OR 17X24 6PK STRL BLUE (TOWEL DISPOSABLE) ×6 IMPLANT
TOWEL OR 17X26 10 PK STRL BLUE (TOWEL DISPOSABLE) ×3 IMPLANT
WATER STERILE IRR 1000ML POUR (IV SOLUTION) ×1 IMPLANT

## 2017-07-24 NOTE — Progress Notes (Signed)
C/o pain Dr Fransisco Beau informed and new orders noted.

## 2017-07-24 NOTE — Anesthesia Preprocedure Evaluation (Addendum)
Anesthesia Evaluation  Patient identified by MRN, date of birth, ID band Patient awake    Reviewed: Allergy & Precautions, NPO status , Patient's Chart, lab work & pertinent test results  Airway Mallampati: II  TM Distance: >3 FB Neck ROM: Limited    Dental no notable dental hx. (+) Edentulous Upper, Edentulous Lower   Pulmonary neg pulmonary ROS,    Pulmonary exam normal breath sounds clear to auscultation       Cardiovascular hypertension, Normal cardiovascular exam Rhythm:Regular Rate:Normal     Neuro/Psych Hx C5-6 cord compression CVA negative psych ROS   GI/Hepatic negative GI ROS, Neg liver ROS,   Endo/Other  Obesity  Renal/GU negative Renal ROS  negative genitourinary   Musculoskeletal negative musculoskeletal ROS (+)   Abdominal   Peds  Hematology  (+) Blood dyscrasia, , Anti-thombin C deficiency, hx CML   Anesthesia Other Findings   Reproductive/Obstetrics                            Anesthesia Physical Anesthesia Plan  ASA: II  Anesthesia Plan: General   Post-op Pain Management:    Induction: Intravenous  PONV Risk Score and Plan: 2 and Ondansetron and Dexamethasone  Airway Management Planned: Oral ETT  Additional Equipment:   Intra-op Plan:   Post-operative Plan: Extubation in OR  Informed Consent: I have reviewed the patients History and Physical, chart, labs and discussed the procedure including the risks, benefits and alternatives for the proposed anesthesia with the patient or authorized representative who has indicated his/her understanding and acceptance.   Dental advisory given  Plan Discussed with: CRNA  Anesthesia Plan Comments:         Anesthesia Quick Evaluation

## 2017-07-24 NOTE — Anesthesia Postprocedure Evaluation (Signed)
Anesthesia Post Note  Patient: MARIEL GAUDIN  Procedure(s) Performed: Procedure(s) (LRB): OPEN REDUCTION INTERNAL FIXATION (ORIF) LEFT CLAVICULAR FRACTURE (Left)     Patient location during evaluation: PACU Anesthesia Type: General Level of consciousness: awake and alert Pain management: pain level controlled Vital Signs Assessment: post-procedure vital signs reviewed and stable Respiratory status: spontaneous breathing, nonlabored ventilation, respiratory function stable and patient connected to nasal cannula oxygen Cardiovascular status: blood pressure returned to baseline and stable Postop Assessment: no signs of nausea or vomiting Anesthetic complications: no    Last Vitals:  Vitals:   07/24/17 1500 07/24/17 1515  BP: (!) 124/91 122/67  Pulse: 92 89  Resp: 15 18  Temp:    SpO2: 92% 93%    Last Pain:  Vitals:   07/24/17 1445  PainSc: Caldwell Grettel Rames

## 2017-07-24 NOTE — Brief Op Note (Signed)
07/24/2017  1:48 PM  PATIENT:  Cody Nunez  46 y.o. male  PRE-OPERATIVE DIAGNOSIS:  LEFT COMMINUTED CLAVICLE FRACTURE  POST-OPERATIVE DIAGNOSIS: LEFT COMMINUTED CLAVICLE FRACTURE  PROCEDURE:  Procedure(s): OPEN REDUCTION INTERNAL FIXATION (ORIF) LEFT CLAVICULAR FRACTURE (Left)  SURGEON:  Surgeon(s) and Role:    Altamese [place], MD - Primary  PHYSICIAN ASSISTANT: Ainsley Spinner, PA-C  ASSISTANTS: none   ANESTHESIA:   general  EBL:  Total I/O In: 1000 [I.V.:1000] Out: 200 [Blood:200]  BLOOD ADMINISTERED:none  DRAINS: none   LOCAL MEDICATIONS USED:  OTHER Exparel  SPECIMEN:  No Specimen  DISPOSITION OF SPECIMEN:  N/A  COUNTS:  YES  TOURNIQUET:  * No tourniquets in log *  DICTATION: .Other Dictation: Dictation Number [id number]  PLAN OF CARE: Admit for overnight observation  PATIENT DISPOSITION:  PACU - hemodynamically stable.   Delay start of Pharmacological VTE agent (>24hrs) due to surgical blood loss or risk of bleeding: no

## 2017-07-24 NOTE — Discharge Instructions (Signed)
[facility] Discharge Instructions    General Discharge Instructions  WEIGHT BEARING STATUS: Nonweightbearing left arm, do not lift anything heavier than 5lbs with L arm  RANGE OF MOTION/ACTIVITY: gentle shoulder range of motion as tolerated. No formal restrictions. Sling for comfort. Range of motion Left elbow, forearm, wrist and hand as tolerated  Wound Care: daily wound care/dressing changes starting on 07/26/2017. See below  Discharge Wound Care Instructions  Do NOT apply any ointments, solutions or lotions to pin sites or surgical wounds.  These prevent needed drainage and even though solutions like hydrogen peroxide kill bacteria, they also damage cells lining the pin sites that help fight infection.  Applying lotions or ointments can keep the wounds moist and can cause them to breakdown and open up as well. This can increase the risk for infection. When in doubt call the office.  Surgical incisions should be dressed daily.  If any drainage is noted, use one layer of adaptic, then gauze, Kerlix, and an ace wrap.  Once the incision is completely dry and without drainage, it may be left open to air out.  Showering may begin 36-48 hours later.  Cleaning gently with soap and water.  Traumatic wounds should be dressed daily as well.    One layer of adaptic, gauze, Kerlix, then ace wrap.  The adaptic can be discontinued once the draining has ceased    If you have a wet to dry dressing: wet the gauze with saline the squeeze as much saline out so the gauze is moist (not soaking wet), place moistened gauze over wound, then place a dry gauze over the moist one, followed by Kerlix wrap, then ace wrap.   DVT/PE prophylaxis: ok to resume aspirin   Diet: as you were eating previously.  Can use over the counter stool softeners and bowel preparations, such as Miralax, to help with bowel movements.  Narcotics can be constipating.  Be sure to drink plenty of fluids  PAIN MEDICATION  USE AND EXPECTATIONS  You have likely been given narcotic medications to help control your pain.  After a traumatic event that results in an fracture (broken bone) with or without surgery, it is ok to use narcotic pain medications to help control one's pain.  We understand that everyone responds to pain differently and each individual patient will be evaluated on a regular basis for the continued need for narcotic medications. Ideally, narcotic medication use should last no more than 6-8 weeks (coinciding with fracture healing).   As a patient it is your responsibility as well to monitor narcotic medication use and report the amount and frequency you use these medications when you come to your office visit.   We would also advise that if you are using narcotic medications, you should take a dose prior to therapy to maximize you participation.  IF YOU ARE ON NARCOTIC MEDICATIONS IT IS NOT PERMISSIBLE TO OPERATE A MOTOR VEHICLE (MOTORCYCLE/CAR/TRUCK/MOPED) OR HEAVY MACHINERY DO NOT MIX NARCOTICS WITH OTHER CNS (CENTRAL NERVOUS SYSTEM) DEPRESSANTS SUCH AS ALCOHOL   STOP SMOKING OR USING NICOTINE PRODUCTS!!!!  As discussed nicotine severely impairs your body's ability to heal surgical and traumatic wounds but also impairs bone healing.  Wounds and bone heal by forming microscopic blood vessels (angiogenesis) and nicotine is a vasoconstrictor (essentially, shrinks blood vessels).  Therefore, if vasoconstriction occurs to these microscopic blood vessels they essentially disappear and are unable to deliver necessary nutrients to the healing tissue.  This is one modifiable factor that you can  do to dramatically increase your chances of healing your injury.    (This means no smoking, no nicotine gum, patches, etc)  DO NOT USE NONSTEROIDAL ANTI-INFLAMMATORY DRUGS (NSAID'S)  Using products such as Advil (ibuprofen), Aleve (naproxen), Motrin (ibuprofen) for additional pain control during fracture healing can delay  and/or prevent the healing response.  If you would like to take over the counter (OTC) medication, Tylenol (acetaminophen) is ok.  However, some narcotic medications that are given for pain control contain acetaminophen as well. Therefore, you should not exceed more than 4000 mg of tylenol in a day if you do not have liver disease.  Also note that there are may OTC medicines, such as cold medicines and allergy medicines that my contain tylenol as well.  If you have any questions about medications and/or interactions please ask your doctor/PA or your pharmacist.      ICE AND ELEVATE INJURED/OPERATIVE EXTREMITY  Using ice and elevating the injured extremity above your heart can help with swelling and pain control.  Icing in a pulsatile fashion, such as 20 minutes on and 20 minutes off, can be followed.    Do not place ice directly on skin. Make sure there is a barrier between to skin and the ice pack.    Using frozen items such as frozen peas works well as the conform nicely to the are that needs to be iced.  USE AN ACE WRAP OR TED HOSE FOR SWELLING CONTROL  In addition to icing and elevation, Ace wraps or TED hose are used to help limit and resolve swelling.  It is recommended to use Ace wraps or TED hose until you are informed to stop.    When using Ace Wraps start the wrapping distally (farthest away from the body) and wrap proximally (closer to the body)   Example: If you had surgery on your leg or thing and you do not have a splint on, start the ace wrap at the toes and work your way up to the thigh        If you had surgery on your upper extremity and do not have a splint on, start the ace wrap at your fingers and work your way up to the upper arm  IF YOU ARE IN A SPLINT OR CAST DO NOT [place]   If your splint gets wet for any reason please contact the office immediately. You may shower in your splint or cast as long as you keep it dry.  This can be done by wrapping in a cast  cover or garbage back (or similar)  Do Not stick any thing down your splint or cast such as pencils, money, or hangers to try and scratch yourself with.  If you feel itchy take benadryl as prescribed on the bottle for itching  IF YOU ARE IN A CAM BOOT (BLACK BOOT)  You may remove boot periodically. Perform daily dressing changes as noted below.  Wash the liner of the boot regularly and wear a sock when wearing the boot. It is recommended that you sleep in the boot until told otherwise  CALL THE OFFICE WITH ANY QUESTIONS OR CONCERNS: [phone]        Discharge Wound Care Instructions  Do NOT apply any ointments, solutions or lotions to pin sites or surgical wounds.  These prevent needed drainage and even though solutions like hydrogen peroxide kill bacteria, they also damage cells lining the pin sites that help fight infection.  Applying lotions  or ointments can keep the wounds moist and can cause them to breakdown and open up as well. This can increase the risk for infection. When in doubt call the office.  Surgical incisions should be dressed daily.  If any drainage is noted, use one layer of adaptic, then gauze, Kerlix, and an ace wrap.  Once the incision is completely dry and without drainage, it may be left open to air out.  Showering may begin 36-48 hours later.  Cleaning gently with soap and water.  Traumatic wounds should be dressed daily as well.    One layer of adaptic, gauze, Kerlix, then ace wrap.  The adaptic can be discontinued once the draining has ceased    If you have a wet to dry dressing: wet the gauze with saline the squeeze as much saline out so the gauze is moist (not soaking wet), place moistened gauze over wound, then place a dry gauze over the moist one, followed by Kerlix wrap, then ace wrap.

## 2017-07-24 NOTE — Transfer of Care (Signed)
Immediate Anesthesia Transfer of Care Note  Patient: Cody Nunez  Procedure(s) Performed: Procedure(s): OPEN REDUCTION INTERNAL FIXATION (ORIF) LEFT CLAVICULAR FRACTURE (Left)  Patient Location: PACU  Anesthesia Type:General  Level of Consciousness: awake, alert , oriented and patient cooperative  Airway & Oxygen Therapy: Patient Spontanous Breathing and Patient connected to nasal cannula oxygen  Post-op Assessment: Report given to RN and Post -op Vital signs reviewed and stable  Post vital signs: Reviewed and stable  Last Vitals:  Vitals:   07/24/17 0758 07/24/17 1412  BP: 128/78   Pulse: 87   Resp: 18   Temp: 37.2 C (P) 36.8 C    Last Pain:  Vitals:   07/24/17 0814  PainSc: 8       Patients Stated Pain Goal: 3 (19/16/60 6004)  Complications: No apparent anesthesia complications
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
## 2017-07-25 ENCOUNTER — Encounter (HOSPITAL_COMMUNITY): Payer: Self-pay | Admitting: Orthopedic Surgery

## 2017-07-25 DIAGNOSIS — S42022A Displaced fracture of shaft of left clavicle, initial encounter for closed fracture: Secondary | ICD-10-CM | POA: Diagnosis not present

## 2017-07-25 MED ORDER — ACETAMINOPHEN 500 MG PO TABS: 1000.0000 mg | ORAL_TABLET | Freq: Four times a day (QID) | ORAL | 0 refills | Status: DC

## 2017-07-25 MED ORDER — ONDANSETRON HCL 4 MG PO TABS: 4.0000 mg | ORAL_TABLET | Freq: Four times a day (QID) | ORAL | 0 refills | Status: DC | PRN

## 2017-07-25 MED FILL — oxyCODONE HCL 10 MG TABS: 10 | 5 days supply | Qty: 60 | Fill #0

## 2017-07-25 NOTE — Progress Notes (Signed)
Orthopedic Trauma Service Progress Note   Patient ID: Cody Nunez MRN: [id number] DOB/AGE: Jul 17, 1971 46 y.o.  Subjective:  Doing well L shoulder sore No new complaints  Fixated on pain medicine Asking if he can have one more dose of IV meds before he discharges    Review of Systems  Constitutional: Negative for chills and fever.  Respiratory: Negative for shortness of breath and wheezing.   Cardiovascular: Negative for chest pain and palpitations.  Neurological: Negative for tingling and tremors.    Objective:   VITALS:   Vitals:   07/24/17 1830 07/24/17 2000 07/25/17 0045 07/25/17 0513  BP: (!) 149/97 138/76 (!) 150/88 (!) 150/80  Pulse: 79 72 74 79  Resp: 17 18 18 18   Temp:  97.9 F (36.6 C) 99.1 F (37.3 C) 99 F (37.2 C)  TempSrc:  Oral Oral Oral  SpO2: 95% 98% 94% 100%    Intake/Output      08/14 0701 - 08/15 0700 08/15 0701 - 08/16 0700   I.V. 1806.3    IV Piggyback 100    Total Intake 1906.3     Urine 3000    Blood 250    Total Output 3250     Net -1343.8          Urine Occurrence 1 x      LABS  No results found for this or any previous visit (from the past 24 hour(s)).   PHYSICAL EXAM:   Gen: sitting on EOB, working with OT Lungs: breathing unlabored Cardiac: RRR Ext:       Left Upper Extremity   Dressing with scant drainage   R/U/M/Ax motor and sensory functions intact  Ext warm    + Radial pulse   Swelling minimal   Sling fitting well   Assessment/Plan: 1 Day Post-Op   Active Problems:   Closed displaced fracture of left clavicle   Anti-infectives    Start     Dose/Rate Route Frequency Ordered Stop   07/24/17 2000  ceFAZolin (ANCEF) IVPB 1 g/50 mL premix     1 g 100 mL/hr over 30 Minutes Intravenous Every 6 hours 07/24/17 1912 07/25/17 1359   07/24/17 0930  ceFAZolin (ANCEF) 3 g in dextrose 5 % 50 mL IVPB     3 g 130 mL/hr over 30 Minutes Intravenous To [facility] 07/23/17 1454  07/24/17 1116    .  POD/HD#: 1   -comminuted L clavicle fracture with ipsilateral rib fractures s/p ORIF L clavicle:  No lifting greater than 5lbs o/w no restrictions  ROM as tolerated  Dressing changes starting on 07/27/2017  Ok to shower and clean wound with soap and water  Follow up with ortho in 10-14 days   - Pain management:  New Rx for Oxy IR written  Needs to obtain future Rxs from PCP who writes his chronic meds   - Medical issues   Home meds  - DVT/PE prophylaxis:  Ambulation  No pharmacologics needed  - ID:   periop abx completed    - Dispo:  Dc home today  Follow up with TS for staple removal from scalp in 1 week  folllow up with ortho in 2 weeks      Jari Pigg, PA-C [facility] [phone] (P) [phone] (O) 07/25/2017, 9:11 AM

## 2017-07-25 NOTE — Discharge Summary (Signed)
Orthopaedic Trauma Service (OTS)  Patient ID: Cody Nunez MRN: [id number] DOB/AGE: [birth year] 46 y.o.  Admit date: 07/24/2017 Discharge date: 07/25/2017  Admission Diagnoses: Comminuted closed left clavicle fracture Left rib fractures Chronic pain syndrome Hypertension  Discharge Diagnoses:  Principal Problem:   Closed displaced fracture of left clavicle Active Problems:   Multiple rib fractures   Chronic pain syndrome   Benign essential HTN   Procedures Performed: 07/24/2017- Dr. Marcelino Scot Open reduction and internal fixation of left clavicle   Discharged Condition: good  Hospital Course:   46 year old white male who was originally admitted to the hospital last week after sustaining an outdoor injury. It was first believed that he was injured while falling out of a deer stand. Patient had no recollection of his injury However, that it was Found that he had that he was attacked by a male deer. Patient was admitted to the trauma service at that time with a small left pneumothorax with left clavicle fracture and left rib fractures. He will was agreeable to surgical intervention for his left clavicle fracture. He was discharged home the weekend and followed up on Tuesday for repair of his left clavicle. Patient was taken to the OR for the procedure noted above. He was admitted overnight for observation given his multiple rib fractures and history of a pneumothorax. Postoperative chest x-ray was obtained which did not demonstrate any residual pneumothorax. He was again admitted for pain control and therapies. He work with occupational therapy on postoperative day #1and did very well. He was deemed stable for discharge home on postoperative day #1. Patient did not require any pharmacologic DVT/PE prophylaxis as he is ambulatory.patient discharged in stable condition on postoperative day #1   Consults: None  Significant Diagnostic Studies: labs:  No significant laboratory  findings  No residual pneumothorax on left side postoperatively  Treatments: IV hydration, antibiotics: Ancef, analgesia: Dilaudid, Tylenol and oxycodone, therapies: OT and RN and surgery: as above  Discharge Exam:  Orthopedic Trauma Service Progress Note    Patient ID: Cody Nunez MRN: [id number] DOB/AGE: 13-Feb-1971 46 y.o.   Subjective:   Doing well L shoulder sore No new complaints   Fixated on pain medicine Asking if he can have one more dose of IV meds before he discharges      Review of Systems  Constitutional: Negative for chills and fever.  Respiratory: Negative for shortness of breath and wheezing.   Cardiovascular: Negative for chest pain and palpitations.  Neurological: Negative for tingling and tremors.      Objective:    VITALS:         Vitals:    07/24/17 1830 07/24/17 2000 07/25/17 0045 07/25/17 0513  BP: (!) 149/97 138/76 (!) 150/88 (!) 150/80  Pulse: 79 72 74 79  Resp: 17 18 18 18   Temp:   97.9 F (36.6 C) 99.1 F (37.3 C) 99 F (37.2 C)  TempSrc:   Oral Oral Oral  SpO2: 95% 98% 94% 100%      Intake/Output      08/14 0701 - 08/15 0700 08/15 0701 - 08/16 0700   I.V. 1806.3    IV Piggyback 100    Total Intake 1906.3     Urine 3000    Blood 250    Total Output 3250     Net -1343.8          Urine Occurrence 1 x       LABS   Lab Results Last 24 Hours  No results  found for this or any previous visit (from the past 24 hour(s)).       PHYSICAL EXAM:    Gen: sitting on EOB, working with OT Lungs: breathing unlabored Cardiac: RRR Ext:       Left Upper Extremity              Dressing with scant drainage              R/U/M/Ax motor and sensory functions intact             Ext warm                      + Radial pulse              Swelling minimal              Sling fitting well    Assessment/Plan: 1 Day Post-Op    Active Problems:   Closed displaced fracture of left clavicle               Anti-infectives     Start      Dose/Rate Route Frequency Ordered Stop    07/24/17 2000   ceFAZolin (ANCEF) IVPB 1 g/50 mL premix     1 g 100 mL/hr over 30 Minutes Intravenous Every 6 hours 07/24/17 1912 07/25/17 1359    07/24/17 0930   ceFAZolin (ANCEF) 3 g in dextrose 5 % 50 mL IVPB     3 g 130 mL/hr over 30 Minutes Intravenous To ShortStay Surgical 07/23/17 1454 07/24/17 1116     .   POD/HD#: 1     -comminuted L clavicle fracture with ipsilateral rib fractures s/p ORIF L clavicle:             No lifting greater than 5lbs o/w no restrictions             ROM as tolerated             Dressing changes starting on 07/27/2017             Ok to shower and clean wound with soap and water             Follow up with ortho in 10-14 days    - Pain management:             New Rx for Oxy IR written             Needs to obtain future Rxs from PCP who writes his chronic meds    - Medical issues              Home meds   - DVT/PE prophylaxis:             Ambulation             No pharmacologics needed   - ID:              periop abx completed      - Dispo:             Dc home today             Follow up with TS for staple removal from scalp in 1 week             folllow up with ortho in 2 weeks        Disposition: 01-Home or Self Care  Discharge Instructions    Call MD / Call 911  Complete by:  As directed    If you experience chest pain or shortness of breath, CALL 911 and be transported to the hospital emergency room.  If you develope a fever above 101 F, pus (white drainage) or increased drainage or redness at the wound, or calf pain, call your surgeon's office.   Constipation Prevention    Complete by:  As directed    Drink plenty of fluids.  Prune juice may be helpful.  You may use a stool softener, such as Colace (over the counter) 100 mg twice a day.  Use MiraLax (over the counter) for constipation as needed.   Diet general    Complete by:  As directed    Discharge instructions    Complete by:  As  directed    Orthopaedic Trauma Service Discharge Instructions    General Discharge Instructions  WEIGHT BEARING STATUS: Nonweightbearing left arm, do not lift anything heavier than 5lbs with L arm  RANGE OF MOTION/ACTIVITY: gentle shoulder range of motion as tolerated. No formal restrictions. Sling for comfort. Range of motion Left elbow, forearm, wrist and hand as tolerated  Wound Care: daily wound care/dressing changes starting on 07/26/2017. See below  Discharge Wound Care Instructions  Do NOT apply any ointments, solutions or lotions to pin sites or surgical wounds.  These prevent needed drainage and even though solutions like hydrogen peroxide kill bacteria, they also damage cells lining the pin sites that help fight infection.  Applying lotions or ointments can keep the wounds moist and can cause them to breakdown and open up as well. This can increase the risk for infection. When in doubt call the office.  Surgical incisions should be dressed daily.  If any drainage is noted, use one layer of adaptic, then gauze, Kerlix, and an ace wrap.  Once the incision is completely dry and without drainage, it may be left open to air out.  Showering may begin 36-48 hours later.  Cleaning gently with soap and water.  Traumatic wounds should be dressed daily as well.    One layer of adaptic, gauze, Kerlix, then ace wrap.  The adaptic can be discontinued once the draining has ceased    If you have a wet to dry dressing: wet the gauze with saline the squeeze as much saline out so the gauze is moist (not soaking wet), place moistened gauze over wound, then place a dry gauze over the moist one, followed by Kerlix wrap, then ace wrap.   DVT/PE prophylaxis: ok to resume aspirin   Diet: as you were eating previously.  Can use over the counter stool softeners and bowel preparations, such as Miralax, to help with bowel movements.  Narcotics can be constipating.  Be sure to drink plenty of  fluids  PAIN MEDICATION USE AND EXPECTATIONS  You have likely been given narcotic medications to help control your pain.  After a traumatic event that results in an fracture (broken bone) with or without surgery, it is ok to use narcotic pain medications to help control one's pain.  We understand that everyone responds to pain differently and each individual patient will be evaluated on a regular basis for the continued need for narcotic medications. Ideally, narcotic medication use should last no more than 6-8 weeks (coinciding with fracture healing).   As a patient it is your responsibility as well to monitor narcotic medication use and report the amount and frequency you use these medications when you come to your office visit.   We would  also advise that if you are using narcotic medications, you should take a dose prior to therapy to maximize you participation.  IF YOU ARE ON NARCOTIC MEDICATIONS IT IS NOT PERMISSIBLE TO OPERATE A MOTOR VEHICLE (MOTORCYCLE/CAR/TRUCK/MOPED) OR HEAVY MACHINERY DO NOT MIX NARCOTICS WITH OTHER CNS (CENTRAL NERVOUS SYSTEM) DEPRESSANTS SUCH AS ALCOHOL   STOP SMOKING OR USING NICOTINE PRODUCTS!!!!  As discussed nicotine severely impairs your body's ability to heal surgical and traumatic wounds but also impairs bone healing.  Wounds and bone heal by forming microscopic blood vessels (angiogenesis) and nicotine is a vasoconstrictor (essentially, shrinks blood vessels).  Therefore, if vasoconstriction occurs to these microscopic blood vessels they essentially disappear and are unable to deliver necessary nutrients to the healing tissue.  This is one modifiable factor that you can do to dramatically increase your chances of healing your injury.    (This means no smoking, no nicotine gum, patches, etc)  DO NOT USE NONSTEROIDAL ANTI-INFLAMMATORY DRUGS (NSAID'S)  Using products such as Advil (ibuprofen), Aleve (naproxen), Motrin (ibuprofen) for additional pain control during  fracture healing can delay and/or prevent the healing response.  If you would like to take over the counter (OTC) medication, Tylenol (acetaminophen) is ok.  However, some narcotic medications that are given for pain control contain acetaminophen as well. Therefore, you should not exceed more than 4000 mg of tylenol in a day if you do not have liver disease.  Also note that there are may OTC medicines, such as cold medicines and allergy medicines that my contain tylenol as well.  If you have any questions about medications and/or interactions please ask your doctor/PA or your pharmacist.      ICE AND ELEVATE INJURED/OPERATIVE EXTREMITY  Using ice and elevating the injured extremity above your heart can help with swelling and pain control.  Icing in a pulsatile fashion, such as 20 minutes on and 20 minutes off, can be followed.    Do not place ice directly on skin. Make sure there is a barrier between to skin and the ice pack.    Using frozen items such as frozen peas works well as the conform nicely to the are that needs to be iced.  USE AN ACE WRAP OR TED HOSE FOR SWELLING CONTROL  In addition to icing and elevation, Ace wraps or TED hose are used to help limit and resolve swelling.  It is recommended to use Ace wraps or TED hose until you are informed to stop.    When using Ace Wraps start the wrapping distally (farthest away from the body) and wrap proximally (closer to the body)   Example: If you had surgery on your leg or thing and you do not have a splint on, start the ace wrap at the toes and work your way up to the thigh        If you had surgery on your upper extremity and do not have a splint on, start the ace wrap at your fingers and work your way up to the upper arm  IF YOU ARE IN A SPLINT OR CAST DO NOT [place]   If your splint gets wet for any reason please contact the office immediately. You may shower in your splint or cast as long as you keep it dry.  This can be done  by wrapping in a cast cover or garbage back (or similar)  Do Not stick any thing down your splint or cast such as pencils, money, or hangers  to try and scratch yourself with.  If you feel itchy take benadryl as prescribed on the bottle for itching  IF YOU ARE IN A CAM BOOT (BLACK BOOT)  You may remove boot periodically. Perform daily dressing changes as noted below.  Wash the liner of the boot regularly and wear a sock when wearing the boot. It is recommended that you sleep in the boot until told otherwise  CALL THE OFFICE WITH ANY QUESTIONS OR CONCERNS: [phone]        Discharge Wound Care Instructions  Do NOT apply any ointments, solutions or lotions to pin sites or surgical wounds.  These prevent needed drainage and even though solutions like hydrogen peroxide kill bacteria, they also damage cells lining the pin sites that help fight infection.  Applying lotions or ointments can keep the wounds moist and can cause them to breakdown and open up as well. This can increase the risk for infection. When in doubt call the office.  Surgical incisions should be dressed daily.  If any drainage is noted, use one layer of adaptic, then gauze, Kerlix, and an ace wrap.  Once the incision is completely dry and without drainage, it may be left open to air out.  Showering may begin 36-48 hours later.  Cleaning gently with soap and water.  Traumatic wounds should be dressed daily as well.    One layer of adaptic, gauze, Kerlix, then ace wrap.  The adaptic can be discontinued once the draining has ceased    If you have a wet to dry dressing: wet the gauze with saline the squeeze as much saline out so the gauze is moist (not soaking wet), place moistened gauze over wound, then place a dry gauze over the moist one, followed by Kerlix wrap, then ace wrap.   Increase activity slowly as tolerated    Complete by:  As directed    Non weight bearing    Complete by:  As directed    Laterality:  left    Extremity:  Upper     Allergies as of 07/25/2017   No Known Allergies     Medication List    STOP taking these medications   bacitracin ointment     TAKE these medications   acetaminophen 500 MG tablet Commonly known as:  TYLENOL Take 2 tablets (1,000 mg total) by mouth every 6 (six) hours.   GLEEVEC 100 MG tablet Generic drug:  imatinib Take 50 mg by mouth daily. Take with meals and large glass of water.Caution:Chemotherapy   GOODSENSE ASPIRIN 325 MG tablet Generic drug:  aspirin Take 325 mg by mouth daily.   hydrochlorothiazide 12.5 MG tablet Commonly known as:  HYDRODIURIL Take 12.5 mg by mouth daily.   losartan 50 MG tablet Commonly known as:  COZAAR Take 50 mg by mouth daily.   methocarbamol 500 MG tablet Commonly known as:  ROBAXIN Take 1-2 tablets (500-1,000 mg total) by mouth every 6 (six) hours as needed for muscle spasms. What changed:  medication strength  how much to take  when to take this  reasons to take this   ondansetron 4 MG tablet Commonly known as:  ZOFRAN Take 1 tablet (4 mg total) by mouth every 6 (six) hours as needed for nausea.   Oxycodone HCl 10 MG Tabs Take 1-2 tablets (10-20 mg total) by mouth every 4 (four) hours as needed (10 mg for mild pain; 15 mg for moderate pain; 20 mg for severe pain). What changed:  how much to  take  when to take this  reasons to take this   Rivaroxaban 15 MG Tabs tablet Commonly known as:  XARELTO Take 15 mg by mouth daily as needed (for INR).   vardenafil 20 MG tablet Commonly known as:  LEVITRA Take 10 mg by mouth daily as needed for erectile dysfunction.   zolpidem 10 MG tablet Commonly known as:  AMBIEN Take 10 mg by mouth at bedtime.      Follow-up Information    Schedule an appointment as soon as possible for a visit with Altamese [place], MD.   Specialty:  Orthopedic Surgery Why:  For suture removal Contact information: [address] [place]  [address] [phone]           Discharge Instructions and Plan:  46 year old right-hand-dominant male status post outdoors accident with comminuted left clavicle fracture and multiple left-sided rib fractures  -comminuted L clavicle fracture with ipsilateral rib fractures s/p ORIF L clavicle:             No lifting greater than 5lbs o/w no restrictions             ROM as tolerated             Dressing changes starting on 07/27/2017             Ok to shower and clean wound with soap and water             Follow up with ortho in 10-14 days    - Pain management:             New Rx for Oxy IR written             Needs to obtain future Rxs from PCP who writes his chronic meds    - Medical issues              Home meds   - DVT/PE prophylaxis:             Ambulation             No pharmacologics needed   - ID:              periop abx completed      - Dispo:             Dc home today             Follow up with TS for staple removal from scalp in 1 week             folllow up with ortho in 2 weeks         Signed:  Jari Pigg, PA-C [facility] [phone] (P) 07/25/2017, 9:24 AM

## 2017-07-25 NOTE — Evaluation (Signed)
Occupational Therapy Evaluation Patient Details Name: Cody Nunez MRN: [id number] DOB: 22-Oct-1971 Today's Date: 07/25/2017    History of Present Illness Patient is a 46 y/o male with PMH of CML, chronic pain in neck and back who apparently was attacked by a deer stand with scalp laceration, L rib fx's 1, 4-5 and  L clavicle fx. Underwent ORIF of L clavicle on 07/24/17   Clinical Impression   PTA, pt was living with his parents and was performing BADLs. Pt currently requiring Min A for UB ADLs and Min Guard A for LB ADLs. Provided pt with education on one-handed compensatory techniques for ADLs, precautions, exercises, sling management, and edema management.  Pt demonstrating and verbalizing understanding. Answered all pt questions in preparation for dc later today. Recommend dc home once medically stable per physician.     Follow Up Recommendations  No OT follow up;Supervision - Intermittent    Equipment Recommendations  None recommended by OT    Recommendations for Other Services       Precautions / Restrictions Precautions Precautions: Fall Required Braces or Orthoses: Sling (Sling comfort) Restrictions Weight Bearing Restrictions: Yes LUE Weight Bearing: Non weight bearing Other Position/Activity Restrictions: Per Ainsley Spinner, PA, pt cleared for ROM LUE within pt tolerance.       Mobility Bed Mobility Overal bed mobility: Needs Assistance Bed Mobility: Supine to Sit;Sit to Supine     Supine to sit: Min assist;HOB elevated Sit to supine: Min assist;HOB elevated   General bed mobility comments: used R UE to pull up on my hand  Transfers Overall transfer level: Modified independent               General transfer comment: Increased time    Balance Overall balance assessment: Needs assistance Sitting-balance support: No upper extremity supported;Feet supported Sitting balance-Leahy Scale: Good     Standing balance support: No upper extremity  supported;During functional activity Standing balance-Leahy Scale: Good                             ADL either performed or assessed with clinical judgement   ADL Overall ADL's : Needs assistance/impaired           Upper Body Bathing Details (indicate cue type and reason): Educated pt on one handed compensatory techniques for bathing     Upper Body Dressing : Minimal assistance;Sitting;Cueing for compensatory techniques;Cueing for UE precautions;Cueing for sequencing Upper Body Dressing Details (indicate cue type and reason): Pt donnned shirt with Min A Lower Body Dressing: Min guard;Sit to/from stand;Cueing for compensatory techniques Lower Body Dressing Details (indicate cue type and reason): Donned pants and socks with Min Guard for safety             Functional mobility during ADLs: Min guard General ADL Comments: Provided pt with education on slign management, positioning, UB bathing, UB dressing with one hand, and LB ADLs with one hand. Pt verbalizing understanding.      Vision         Perception     Praxis      Pertinent Vitals/Pain Pain Assessment: Faces Faces Pain Scale: Hurts even more Pain Location: L clavicle, ribs Pain Descriptors / Indicators: Grimacing;Guarding;Burning Pain Intervention(s): Monitored during session;Limited activity within patient's tolerance;Repositioned;Patient requesting pain meds-RN notified     Hand Dominance Right   Extremity/Trunk Assessment Upper Extremity Assessment Upper Extremity Assessment: LUE deficits/detail LUE Deficits / Details: in sling (clavicle fx), able to use hand  LUE: Unable to fully assess due to pain LUE Coordination: decreased gross motor   Lower Extremity Assessment Lower Extremity Assessment: Overall WFL for tasks assessed   Cervical / Trunk Assessment Cervical / Trunk Assessment: Other exceptions Cervical / Trunk Exceptions: L side rib fxs   Communication Communication Communication:  No difficulties   Cognition Arousal/Alertness: Awake/alert Behavior During Therapy: WFL for tasks assessed/performed Overall Cognitive Status: Within Functional Limits for tasks assessed                                     General Comments  MD and PA come during session to confirm ROM and activity    Exercises Exercises: Shoulder General Exercises - Upper Extremity Elbow Flexion: AROM;Left;15 reps;Supine Elbow Extension: AROM;Left;15 reps;Supine Wrist Flexion: AROM;Left;15 reps;Seated Wrist Extension: AROM;Left;15 reps;Seated Digit Composite Flexion: AROM;Left;15 reps;Seated Composite Extension: AROM;Left;15 reps;Seated   Shoulder Instructions      Home Living Family/patient expects to be discharged to:: Private residence Living Arrangements: Parent Available Help at Discharge: Family;Available 24 hours/day Type of Home: House Home Access: Level entry     Home Layout: One level     Bathroom Shower/Tub: Teacher, early years/pre: Standard     Home Equipment: Hand held shower head          Prior Functioning/Environment Level of Independence: Independent                 OT Problem List: Decreased range of motion;Decreased activity tolerance;Decreased knowledge of use of DME or AE;Decreased knowledge of precautions;Pain;Impaired UE functional use      OT Treatment/Interventions:      OT Goals(Current goals can be found in the care plan section) Acute Rehab OT Goals Patient Stated Goal: Go home OT Goal Formulation: With patient Time For Goal Achievement: 08/08/17 Potential to Achieve Goals: Good  OT Frequency:     Barriers to D/C:            Co-evaluation              AM-PAC PT "6 Clicks" Daily Activity     Outcome Measure Help from another person eating meals?: None Help from another person taking care of personal grooming?: None Help from another person toileting, which includes using toliet, bedpan, or urinal?:  None Help from another person bathing (including washing, rinsing, drying)?: A Little Help from another person to put on and taking off regular upper body clothing?: A Little Help from another person to put on and taking off regular lower body clothing?: A Little 6 Click Score: 21   End of Session Equipment Utilized During Treatment: Other (comment) (Sling) Nurse Communication: Mobility status;Patient requests pain meds  Activity Tolerance: Patient tolerated treatment well;Patient limited by pain Patient left: in bed;with call bell/phone within reach  OT Visit Diagnosis: Pain Pain - Right/Left: Left Pain - part of body: Shoulder;Arm;Hand (Ribs)                Time: 1610-9604 OT Time Calculation (min): 33 min Charges:  OT General Charges $OT Visit: 1 Procedure OT Evaluation $OT Eval Low Complexity: 1 Procedure OT Treatments $Self Care/Home Management : 8-22 mins G-Codes: OT G-codes **NOT FOR INPATIENT CLASS** Functional Assessment Tool Used: Clinical judgement Functional Limitation: Self care Self Care Current Status (V4098): At least 1 percent but less than 20 percent impaired, limited or restricted Self Care Goal Status (J1914): 0 percent impaired, limited or  restricted Self Care Discharge Status [phone]): At least 1 percent but less than 20 percent impaired, limited or restricted   [place], OTR/L Acute Rehab Pager: [phone] Office: [place] 07/25/2017, 9:06 AM
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
## 2017-07-25 NOTE — Progress Notes (Signed)
Reviewed discharge instructions/medications with patient.  Answered all of his questions. Explained how to get [facility] from the hospital. Patient is stable and ready for discharge.

## 2017-07-27 ENCOUNTER — Telehealth (HOSPITAL_COMMUNITY): Payer: Self-pay

## 2017-07-27 NOTE — Telephone Encounter (Signed)
[phone] needs appt to get staples out

## 2017-07-27 NOTE — Telephone Encounter (Signed)
Appointment given for 8/23 at 11am. Patient notified and advised to arrive 30 minutes early.

## 2017-07-30 MED FILL — oxyCODONE HCL 10 MG TABS: 10 | 10 days supply | Qty: 80 | Fill #0

## 2019-03-08 IMAGING — DX DG CLAVICLE*L*
2 series · 2 of 2 positions shown · non-contrast
Comparison: Intraoperative fluoro spot views of today's date.

CLINICAL DATA: Status post ORIF of the left clavicle for midshaft
fracture.

EXAM:
LEFT CLAVICLE - 2+ VIEWS

[clavicle ap]
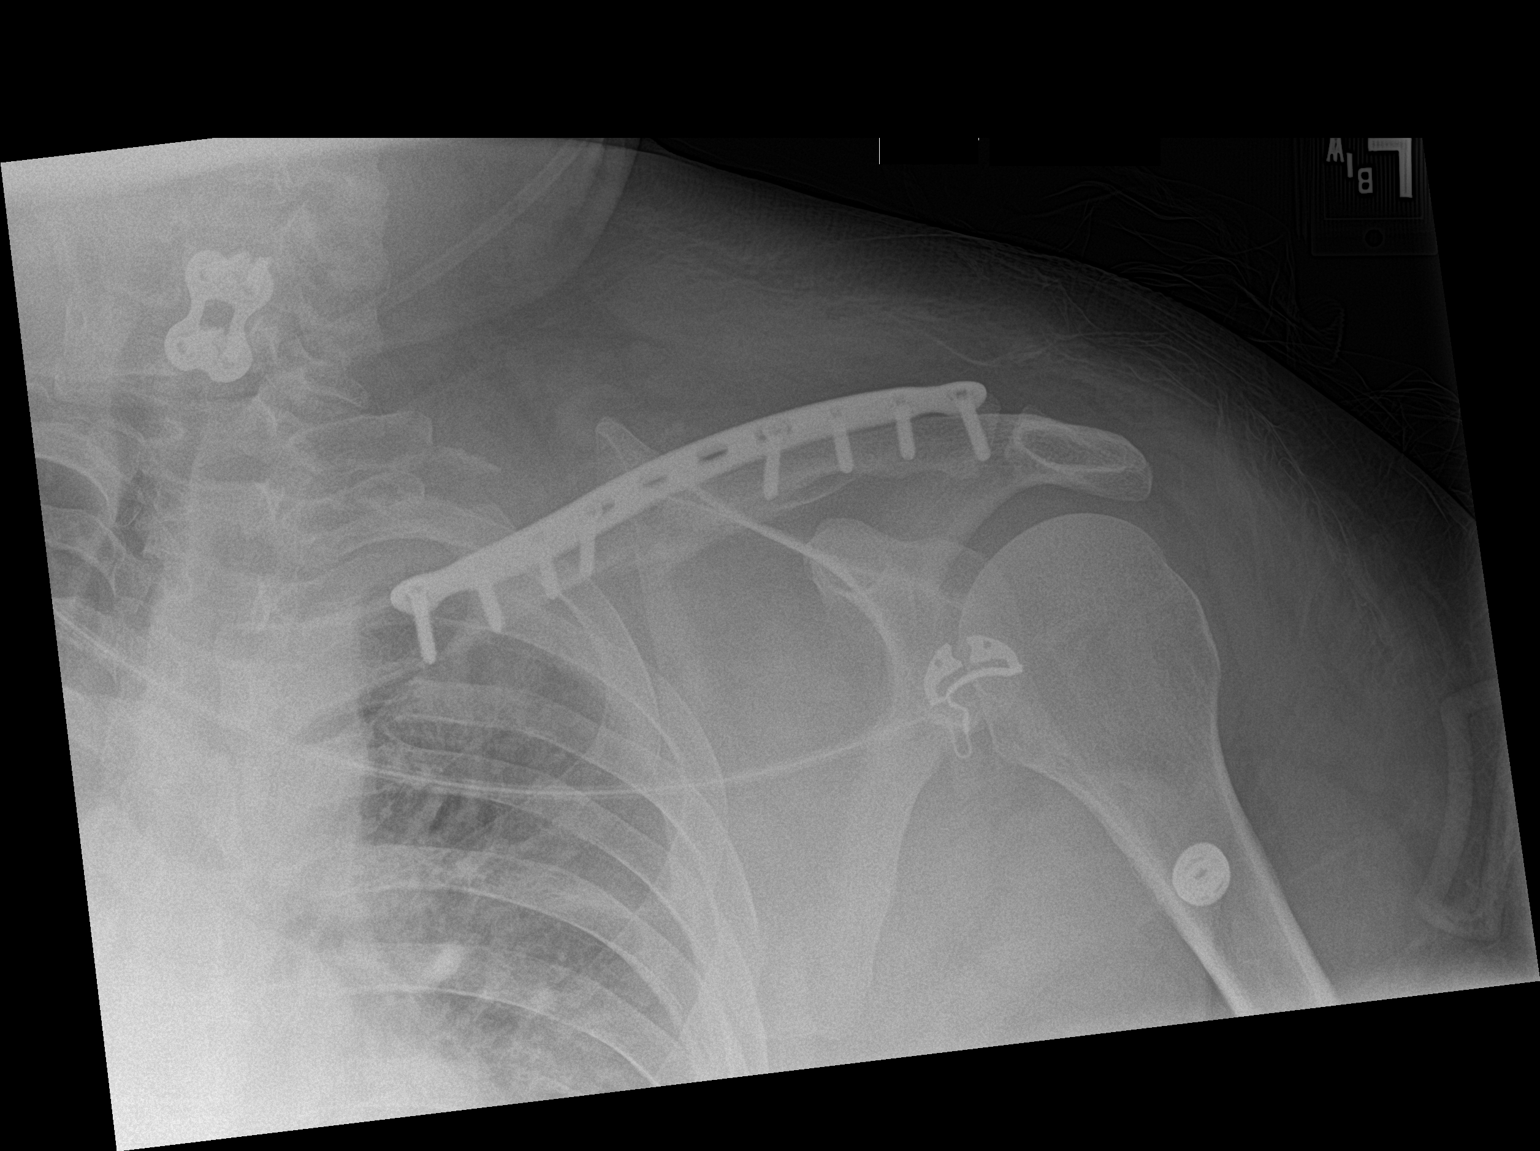

[clavicle axial]
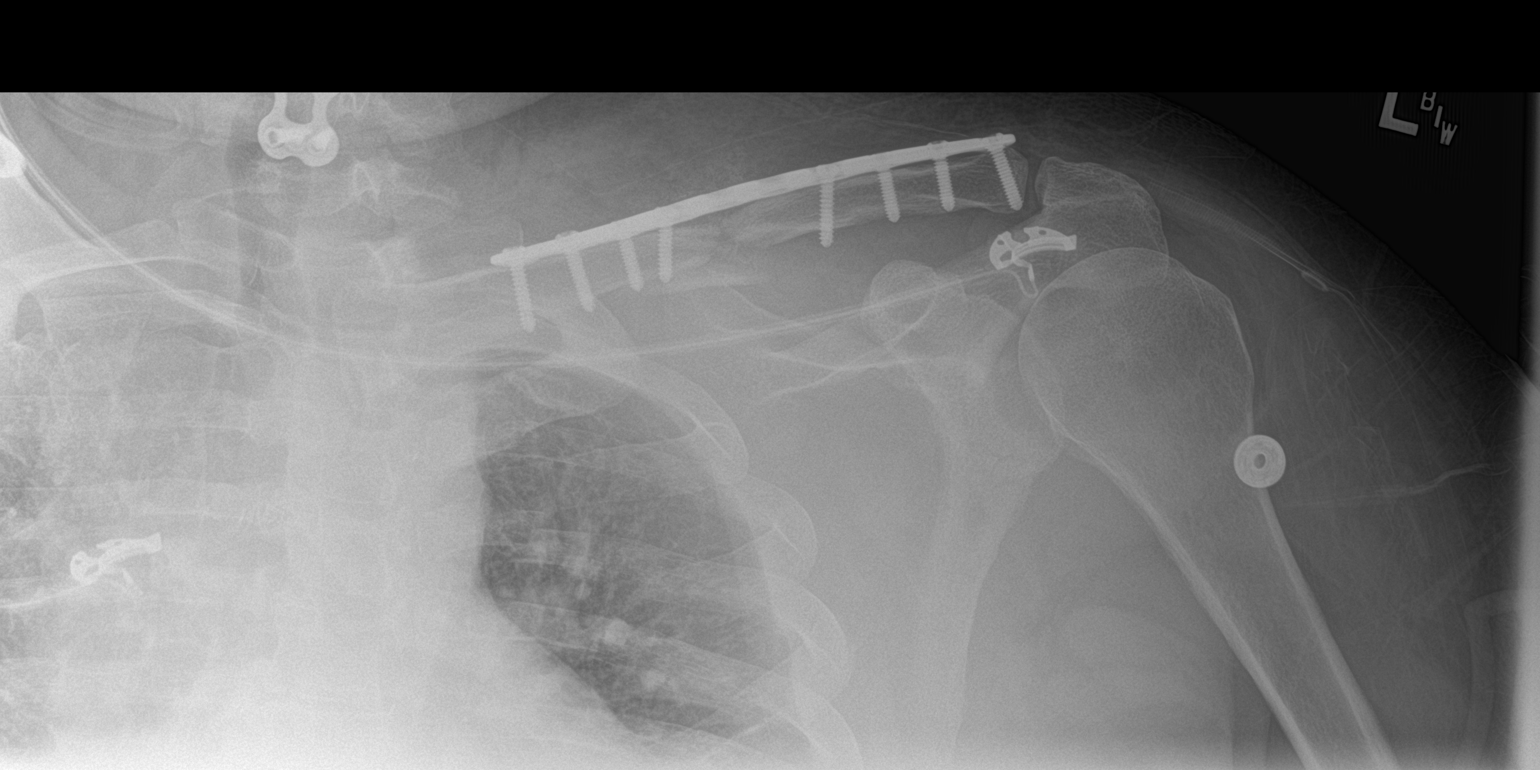

[2 of 2 positions shown; findings below may reference images not displayed]

FINDINGS: Two views of the left clavicle reveal the mid shaft fracture to a
been reduced and now alignment is near anatomic. The metallic plate
and cortical screws are in reasonable position.
IMPRESSION: Post ORIF images reveal near anatomic alignment of the left
clavicle.

## 2023-08-02 DIAGNOSIS — K766 Portal hypertension: Secondary | ICD-10-CM | POA: Insufficient documentation

## 2024-09-17 ENCOUNTER — Observation Stay
Admission: EM | Admit: 2024-09-17 | Discharge: 2024-09-18 | Disposition: A | Discharge status: Home / Self Care | Attending: Obstetrics and Gynecology | Admitting: Obstetrics and Gynecology

## 2024-09-17 ENCOUNTER — Other Ambulatory Visit: Payer: Self-pay

## 2024-09-17 ENCOUNTER — Emergency Department

## 2024-09-17 DIAGNOSIS — K429 Umbilical hernia without obstruction or gangrene: Secondary | ICD-10-CM | POA: Diagnosis not present

## 2024-09-17 DIAGNOSIS — G9341 Metabolic encephalopathy: Secondary | ICD-10-CM | POA: Diagnosis present

## 2024-09-17 DIAGNOSIS — R410 Disorientation, unspecified: Principal | ICD-10-CM | POA: Insufficient documentation

## 2024-09-17 DIAGNOSIS — E669 Obesity, unspecified: Secondary | ICD-10-CM | POA: Diagnosis not present

## 2024-09-17 DIAGNOSIS — G894 Chronic pain syndrome: Secondary | ICD-10-CM | POA: Insufficient documentation

## 2024-09-17 DIAGNOSIS — F1410 Cocaine abuse, uncomplicated: Secondary | ICD-10-CM | POA: Diagnosis not present

## 2024-09-17 DIAGNOSIS — Z794 Long term (current) use of insulin: Secondary | ICD-10-CM | POA: Diagnosis not present

## 2024-09-17 DIAGNOSIS — Z6837 Body mass index (BMI) 37.0-37.9, adult: Secondary | ICD-10-CM | POA: Diagnosis not present

## 2024-09-17 DIAGNOSIS — E119 Type 2 diabetes mellitus without complications: Secondary | ICD-10-CM | POA: Insufficient documentation

## 2024-09-17 DIAGNOSIS — I10 Essential (primary) hypertension: Secondary | ICD-10-CM | POA: Insufficient documentation

## 2024-09-17 DIAGNOSIS — Z8719 Personal history of other diseases of the digestive system: Secondary | ICD-10-CM | POA: Diagnosis not present

## 2024-09-17 DIAGNOSIS — K7460 Unspecified cirrhosis of liver: Secondary | ICD-10-CM | POA: Insufficient documentation

## 2024-09-17 DIAGNOSIS — G9340 Encephalopathy, unspecified: Principal | ICD-10-CM

## 2024-09-17 DIAGNOSIS — C9210 Chronic myeloid leukemia, BCR/ABL-positive, not having achieved remission: Secondary | ICD-10-CM | POA: Insufficient documentation

## 2024-09-17 HISTORY — DX: Other chronic pain: G89.29

## 2024-09-17 HISTORY — DX: Chronic myeloid leukemia, BCR/ABL-positive, not having achieved remission: C92.10

## 2024-09-17 HISTORY — DX: Cocaine abuse, uncomplicated: F14.10

## 2024-09-17 HISTORY — DX: Unspecified cirrhosis of liver: K74.60

## 2024-09-17 HISTORY — DX: Type 2 diabetes mellitus without complications: E11.9

## 2024-09-17 LAB — CBC WITH DIFFERENTIAL/PLATELET
Abs Immature Granulocytes: 0.02 K/uL (ref 0.00–0.07)
Basophils Absolute: 0.1 K/uL (ref 0.0–0.1)
Basophils Relative: 1 %
Eosinophils Absolute: 0 K/uL (ref 0.0–0.5)
Eosinophils Relative: 1 %
HCT: 43.5 % (ref 39.0–52.0)
Hemoglobin: 15.9 g/dL (ref 13.0–17.0)
Immature Granulocytes: 0 %
Lymphocytes Relative: 21 %
Lymphs Abs: 1.3 K/uL (ref 0.7–4.0)
MCH: 32.4 pg (ref 26.0–34.0)
MCHC: 36.6 g/dL — ABNORMAL HIGH (ref 30.0–36.0)
MCV: 88.6 fL (ref 80.0–100.0)
Monocytes Absolute: 0.4 K/uL (ref 0.1–1.0)
Monocytes Relative: 6 %
Neutro Abs: 4.3 K/uL (ref 1.7–7.7)
Neutrophils Relative %: 71 %
Platelets: 135 K/uL — ABNORMAL LOW (ref 150–400)
RBC: 4.91 MIL/uL (ref 4.22–5.81)
RDW: 13.2 % (ref 11.5–15.5)
WBC: 6.1 K/uL (ref 4.0–10.5)
nRBC: 0 % (ref 0.0–0.2)

## 2024-09-17 LAB — COMPREHENSIVE METABOLIC PANEL WITH GFR
ALT: 51 U/L — ABNORMAL HIGH (ref 0–44)
AST: 64 U/L — ABNORMAL HIGH (ref 15–41)
Albumin: 4.1 g/dL (ref 3.5–5.0)
Alkaline Phosphatase: 420 U/L — ABNORMAL HIGH (ref 38–126)
Anion gap: 12 (ref 5–15)
BUN: 12 mg/dL (ref 6–20)
CO2: 27 mmol/L (ref 22–32)
Calcium: 9.2 mg/dL (ref 8.9–10.3)
Chloride: 95 mmol/L — ABNORMAL LOW (ref 98–111)
Creatinine, Ser: 0.81 mg/dL (ref 0.61–1.24)
GFR, Estimated: >60 mL/min (ref >60)
Glucose, Bld: 355 mg/dL — ABNORMAL HIGH (ref 70–99)
Potassium: 3.8 mmol/L (ref 3.5–5.1)
Sodium: 134 mmol/L — ABNORMAL LOW (ref 135–145)
Total Bilirubin: 0.9 mg/dL (ref 0.0–1.2)
Total Protein: 9 g/dL — ABNORMAL HIGH (ref 6.5–8.1)

## 2024-09-17 LAB — URINE DRUG SCREEN, QUALITATIVE (ARMC ONLY)
Amphetamines, Ur Screen: NOT DETECTED
Barbiturates, Ur Screen: NOT DETECTED
Benzodiazepine, Ur Scrn: POSITIVE — AB
Cannabinoid 50 Ng, Ur ~~LOC~~: NOT DETECTED
Cocaine Metabolite,Ur ~~LOC~~: POSITIVE — AB
MDMA (Ecstasy)Ur Screen: NOT DETECTED
Methadone Scn, Ur: NOT DETECTED
Opiate, Ur Screen: POSITIVE — AB
Phencyclidine (PCP) Ur S: NOT DETECTED
Tricyclic, Ur Screen: NOT DETECTED

## 2024-09-17 LAB — BLOOD GAS, VENOUS
Acid-Base Excess: 5 mmol/L — ABNORMAL HIGH (ref 0.0–2.0)
Bicarbonate: 31 mmol/L — ABNORMAL HIGH (ref 20.0–28.0)
O2 Saturation: 62.9 %
Patient temperature: 37
pCO2, Ven: 50 mmHg (ref 44–60)
pH, Ven: 7.4 (ref 7.25–7.43)
pO2, Ven: 36 mmHg (ref 32–45)

## 2024-09-17 LAB — URINALYSIS, COMPLETE (UACMP) WITH MICROSCOPIC
Bacteria, UA: NONE SEEN
Bilirubin Urine: NEGATIVE
Glucose, UA: >=500 mg/dL — AB
Hgb urine dipstick: NEGATIVE
Ketones, ur: NEGATIVE mg/dL
Leukocytes,Ua: NEGATIVE
Nitrite: NEGATIVE
Protein, ur: NEGATIVE mg/dL
RBC / HPF: 0 RBC/hpf (ref 0–5)
Specific Gravity, Urine: 1.027 (ref 1.005–1.030)
Squamous Epithelial / HPF: 0 /HPF (ref 0–5)
pH: 5 (ref 5.0–8.0)

## 2024-09-17 LAB — AMMONIA: Ammonia: 18 umol/L (ref 9–35)

## 2024-09-17 LAB — PROTIME-INR
INR: 1.2 (ref 0.8–1.2)
Prothrombin Time: 15.9 s — ABNORMAL HIGH (ref 11.4–15.2)

## 2024-09-17 LAB — LIPASE, BLOOD: Lipase: 20 U/L (ref 11–51)

## 2024-09-17 LAB — APTT: aPTT: 36 s (ref 24–36)

## 2024-09-17 LAB — ETHANOL: Alcohol, Ethyl (B): <15 mg/dL (ref <15)

## 2024-09-17 MED ORDER — INSULIN ASPART 100 UNIT/ML IJ SOLN
0.0000 [IU] | Freq: Every day | INTRAMUSCULAR | Status: DC
  Administered 2024-09-17: 2 [IU] via SUBCUTANEOUS
  Filled 2024-09-17: qty 5

## 2024-09-17 MED ORDER — HYDRALAZINE HCL 20 MG/ML IJ SOLN: 5.0000 mg | INTRAMUSCULAR | Status: DC | PRN

## 2024-09-17 MED ORDER — ONDANSETRON HCL 4 MG/2ML IJ SOLN: 4.0000 mg | Freq: Three times a day (TID) | INTRAMUSCULAR | Status: DC | PRN

## 2024-09-17 MED ORDER — HEPARIN SODIUM (PORCINE) 5000 UNIT/ML IJ SOLN: 5000.0000 [IU] | Freq: Three times a day (TID) | INTRAMUSCULAR | Status: DC

## 2024-09-17 MED ORDER — INSULIN ASPART 100 UNIT/ML IJ SOLN
0.0000 [IU] | Freq: Three times a day (TID) | INTRAMUSCULAR | Status: DC
  Administered 2024-09-18: 5 [IU] via SUBCUTANEOUS
  Filled 2024-09-17: qty 5

## 2024-09-17 MED ORDER — ACETAMINOPHEN 325 MG PO TABS: 325.0000 mg | ORAL_TABLET | Freq: Four times a day (QID) | ORAL | Status: DC | PRN

## 2024-09-17 NOTE — ED Notes (Signed)
 C collar removed by this RN per Nicholaus MD.

## 2024-09-17 NOTE — ED Notes (Addendum)
 RN on phone with patients mother [phone]). Per mother, pt states has hx of encephalopathy but has recently changed to new medication. Unable to get clear answer from pt mother on pt's baseline. Mother does state pt is on liver transplant list. MD aware.
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
## 2024-09-17 NOTE — ED Notes (Signed)
 ED Provider at bedside.

## 2024-09-17 NOTE — ED Provider Notes (Signed)
 [facility] Provider Note    Event Date/Time   First MD Initiated Contact with Patient 09/17/24 2040     (approximate)   History   No chief complaint on file.   HPI  Cody Nunez is a 53 y.o. male  PMHx of cirrhosis of liver secondary to NASH, chronic myelocytic leukemia,       Physical Exam   Triage Vital Signs: ED Triage Vitals  Encounter Vitals Group     BP      Girls Systolic BP Percentile      Girls Diastolic BP Percentile      Boys Systolic BP Percentile      Boys Diastolic BP Percentile      Pulse      Resp      Temp      Temp src      SpO2      Weight      Height      Head Circumference      Peak Flow      Pain Score      Pain Loc      Pain Education      Exclude from Growth Chart     Most recent vital signs: There were no vitals filed for this visit.  Nursing Triage Note reviewed. Vital signs reviewed and patients oxygen saturation is normoxic***  General: Patient is well nourished, well developed, awake and alert, resting comfortably in no acute distress Head: Normocephalic and atraumatic Eyes: Normal inspection, extraocular muscles intact, no conjunctival pallor Ear, nose, throat: Normal external exam Neck: Normal range of motion Respiratory: Patient is in no respiratory distress, lungs CTAB Cardiovascular: Patient is not tachycardic, RRR without murmur appreciated GI: Abd SNT with no guarding or rebound  Back: Normal inspection of the back with good strength and range of motion throughout all ext Extremities: pulses intact with good cap refills, no LE pitting edema or calf tenderness Neuro: The patient is alert and oriented to person, place, and time, appropriately conversive, with 5/5 bilat UE/LE strength, no gross motor or sensory defects noted. Coordination appears to be adequate. Skin: Warm, dry, and intact Psych: normal mood and affect, no SI or HI  ED Results / Procedures / Treatments   Labs (all labs  ordered are listed, but only abnormal results are displayed) Labs Reviewed - No data to display   EKG   RADIOLOGY ***    PROCEDURES:  Critical Care performed: {CriticalCareYesNo:19197::Yes, see critical care procedure note(s),No}  Procedures   MEDICATIONS ORDERED IN ED: Medications - No data to display   IMPRESSION / MDM / ASSESSMENT AND PLAN / ED COURSE                                Differential diagnosis includes, but is not limited to, ***    ***     -- Risk: 5 This patient has a high risk of morbidity due to further diagnostic testing or treatment. Rationale: This patient's evaluation and management involve a high risk of morbidity due to the potential severity of presenting symptoms, need for diagnostic testing, and/or initiation of treatment that may require close monitoring. The differential includes conditions with potential for significant deterioration or requiring escalation of care. Treatment decisions in the ED, including medication administration, procedural interventions, or disposition planning, reflect this level of risk. COPA: 5 The patient has the following acute or chronic illness/injury  that poses a possible threat to life or bodily function: [X] : The patient has a potentially serious acute condition or an acute exacerbation of a chronic illness requiring urgent evaluation and management in the Emergency Department. The clinical presentation necessitates immediate consideration of life-threatening or function-threatening diagnoses, even if they are ultimately ruled out.   FINAL CLINICAL IMPRESSION(S) / ED DIAGNOSES   Final diagnoses:  None     Rx / DC Orders   ED Discharge Orders     None        Note:  This document was prepared using Dragon voice recognition software and may include unintentional dictation errors.
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
## 2024-09-18 ENCOUNTER — Encounter: Payer: Self-pay | Admitting: Internal Medicine

## 2024-09-18 ENCOUNTER — Observation Stay

## 2024-09-18 DIAGNOSIS — E119 Type 2 diabetes mellitus without complications: Secondary | ICD-10-CM

## 2024-09-18 DIAGNOSIS — E669 Obesity, unspecified: Secondary | ICD-10-CM | POA: Diagnosis present

## 2024-09-18 DIAGNOSIS — R410 Disorientation, unspecified: Secondary | ICD-10-CM | POA: Diagnosis not present

## 2024-09-18 DIAGNOSIS — Z8719 Personal history of other diseases of the digestive system: Secondary | ICD-10-CM

## 2024-09-18 LAB — CBC
HCT: 38.9 % — ABNORMAL LOW (ref 39.0–52.0)
Hemoglobin: 13.7 g/dL (ref 13.0–17.0)
MCH: 31.2 pg (ref 26.0–34.0)
MCHC: 35.2 g/dL (ref 30.0–36.0)
MCV: 88.6 fL (ref 80.0–100.0)
Platelets: 115 K/uL — ABNORMAL LOW (ref 150–400)
RBC: 4.39 MIL/uL (ref 4.22–5.81)
RDW: 13.3 % (ref 11.5–15.5)
WBC: 5.8 K/uL (ref 4.0–10.5)
nRBC: 0 % (ref 0.0–0.2)

## 2024-09-18 LAB — COMPREHENSIVE METABOLIC PANEL WITH GFR
ALT: 44 U/L (ref 0–44)
AST: 56 U/L — ABNORMAL HIGH (ref 15–41)
Albumin: 3.3 g/dL — ABNORMAL LOW (ref 3.5–5.0)
Alkaline Phosphatase: 304 U/L — ABNORMAL HIGH (ref 38–126)
Anion gap: 7 (ref 5–15)
BUN: 9 mg/dL (ref 6–20)
CO2: 27 mmol/L (ref 22–32)
Calcium: 8.6 mg/dL — ABNORMAL LOW (ref 8.9–10.3)
Chloride: 100 mmol/L (ref 98–111)
Creatinine, Ser: 0.66 mg/dL (ref 0.61–1.24)
GFR, Estimated: >60 mL/min (ref >60)
Glucose, Bld: 258 mg/dL — ABNORMAL HIGH (ref 70–99)
Potassium: 4.3 mmol/L (ref 3.5–5.1)
Sodium: 134 mmol/L — ABNORMAL LOW (ref 135–145)
Total Bilirubin: 1.2 mg/dL (ref 0.0–1.2)
Total Protein: 7.5 g/dL (ref 6.5–8.1)

## 2024-09-18 LAB — HEMOGLOBIN A1C
Hgb A1c MFr Bld: 9.6 % — ABNORMAL HIGH (ref 4.8–5.6)
Mean Plasma Glucose: 228.82 mg/dL

## 2024-09-18 LAB — CBG MONITORING, ED
Glucose-Capillary: 249 mg/dL — ABNORMAL HIGH (ref 70–99)
Glucose-Capillary: 278 mg/dL — ABNORMAL HIGH (ref 70–99)

## 2024-09-18 MED ORDER — CARVEDILOL 6.25 MG PO TABS
6.2500 mg | ORAL_TABLET | Freq: Two times a day (BID) | ORAL | Status: DC
  Administered 2024-09-18: 6.25 mg via ORAL
  Filled 2024-09-18: qty 1

## 2024-09-18 MED ORDER — GABAPENTIN 100 MG PO TABS: 300.0000 mg | ORAL_TABLET | Freq: Three times a day (TID) | ORAL | Status: AC | PRN

## 2024-09-18 MED ORDER — ACETAMINOPHEN 500 MG PO TABS: 500.0000 mg | ORAL_TABLET | Freq: Four times a day (QID) | ORAL | Status: AC | PRN

## 2024-09-18 MED ORDER — RIFAXIMIN 550 MG PO TABS
550.0000 mg | ORAL_TABLET | Freq: Two times a day (BID) | ORAL | Status: DC
  Administered 2024-09-18 (×2): 550 mg via ORAL
  Filled 2024-09-18 (×2): qty 1

## 2024-09-18 MED ORDER — ZOLPIDEM TARTRATE 5 MG PO TABS
5.0000 mg | ORAL_TABLET | Freq: Every evening | ORAL | Status: DC | PRN
  Administered 2024-09-18: 5 mg via ORAL
  Filled 2024-09-18: qty 1

## 2024-09-18 MED ORDER — LACTULOSE 10 GM/15ML PO SOLN
10.0000 g | Freq: Three times a day (TID) | ORAL | Status: DC | PRN
  Administered 2024-09-18: 10 g via ORAL
  Filled 2024-09-18: qty 30

## 2024-09-18 NOTE — ED Notes (Signed)
 Water given to pt per request. Pt asked about purple scrub top. This Rn searched for shirt in room and could not find it. Pt verbalized understanding.

## 2024-09-18 NOTE — ED Notes (Signed)
 Pt ambulatory to restroom with steady gate. Pt returned to bed without incident. Cb within reach.
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
## 2024-09-18 NOTE — ED Notes (Signed)
Pt mother updated on pt status.
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
## 2024-09-18 NOTE — Inpatient Diabetes Management (Signed)
 Inpatient Diabetes Program Recommendations  AACE/ADA: New Consensus Statement on Inpatient Glycemic Control   Target Ranges:  Prepandial:   less than 140 mg/dL      Peak postprandial:   less than 180 mg/dL (1-2 hours)      Critically ill patients:  140 - 180 mg/dL    Latest Reference Range & Units 09/17/24 23:53 09/18/24 07:14  Glucose-Capillary 70 - 99 mg/dL 750 (H) 721 (H)    Latest Reference Range & Units 09/17/24 20:52  Hemoglobin A1C 4.8 - 5.6 % 9.6 (H)    Review of Glycemic Control  Diabetes history: DM2 Outpatient Diabetes medications: Humalog TID ISS   Current orders for Inpatient glycemic control: Novolog 0-9 units TID + 0-5 units at bedtime.   Inpatient Diabetes Program Recommendations  Please consider starting Lantus 10 units daily    Thanks,  Lavanda Search, RN, MSN, [facility]  Inpatient Diabetes Coordinator  Pager [phone] (8a-5p)

## 2024-09-19 LAB — MISC LABCORP TEST (SEND OUT): Labcorp test code: 83935

## 2024-11-12 ENCOUNTER — Encounter (HOSPITAL_COMMUNITY): Payer: Self-pay

## 2024-11-12 ENCOUNTER — Other Ambulatory Visit: Payer: Self-pay

## 2024-11-12 ENCOUNTER — Emergency Department (HOSPITAL_COMMUNITY)

## 2024-11-12 ENCOUNTER — Emergency Department (HOSPITAL_COMMUNITY)
Admission: EM | Admit: 2024-11-12 | Discharge: 2024-11-12 | Disposition: A | Discharge status: Home / Self Care | Attending: Emergency Medicine | Admitting: Emergency Medicine

## 2024-11-12 DIAGNOSIS — Z7901 Long term (current) use of anticoagulants: Secondary | ICD-10-CM | POA: Diagnosis not present

## 2024-11-12 DIAGNOSIS — E1165 Type 2 diabetes mellitus with hyperglycemia: Secondary | ICD-10-CM | POA: Diagnosis not present

## 2024-11-12 DIAGNOSIS — K7030 Alcoholic cirrhosis of liver without ascites: Secondary | ICD-10-CM | POA: Diagnosis not present

## 2024-11-12 DIAGNOSIS — Z794 Long term (current) use of insulin: Secondary | ICD-10-CM | POA: Diagnosis not present

## 2024-11-12 DIAGNOSIS — Z79899 Other long term (current) drug therapy: Secondary | ICD-10-CM | POA: Insufficient documentation

## 2024-11-12 DIAGNOSIS — R4182 Altered mental status, unspecified: Secondary | ICD-10-CM | POA: Diagnosis present

## 2024-11-12 DIAGNOSIS — K7460 Unspecified cirrhosis of liver: Secondary | ICD-10-CM

## 2024-11-12 LAB — CBC WITH DIFFERENTIAL/PLATELET
Abs Immature Granulocytes: 0.03 K/uL (ref 0.00–0.07)
Basophils Absolute: 0 K/uL (ref 0.0–0.1)
Basophils Relative: 1 %
Eosinophils Absolute: 0.1 K/uL (ref 0.0–0.5)
Eosinophils Relative: 1 %
HCT: 38.9 % — ABNORMAL LOW (ref 39.0–52.0)
Hemoglobin: 13.6 g/dL (ref 13.0–17.0)
Immature Granulocytes: 1 %
Lymphocytes Relative: 24 %
Lymphs Abs: 1.2 K/uL (ref 0.7–4.0)
MCH: 31 pg (ref 26.0–34.0)
MCHC: 35 g/dL (ref 30.0–36.0)
MCV: 88.6 fL (ref 80.0–100.0)
Monocytes Absolute: 0.3 K/uL (ref 0.1–1.0)
Monocytes Relative: 6 %
Neutro Abs: 3.3 K/uL (ref 1.7–7.7)
Neutrophils Relative %: 67 %
Platelets: 103 K/uL — ABNORMAL LOW (ref 150–400)
RBC: 4.39 MIL/uL (ref 4.22–5.81)
RDW: 14.1 % (ref 11.5–15.5)
WBC: 4.9 K/uL (ref 4.0–10.5)
nRBC: 0 % (ref 0.0–0.2)

## 2024-11-12 LAB — HEPATIC FUNCTION PANEL
ALT: 53 U/L — ABNORMAL HIGH (ref 0–44)
AST: 46 U/L — ABNORMAL HIGH (ref 15–41)
Albumin: 3.1 g/dL — ABNORMAL LOW (ref 3.5–5.0)
Alkaline Phosphatase: 359 U/L — ABNORMAL HIGH (ref 38–126)
Bilirubin, Direct: 0.3 mg/dL — ABNORMAL HIGH (ref 0.0–0.2)
Indirect Bilirubin: 0.9 mg/dL (ref 0.3–0.9)
Total Bilirubin: 1.2 mg/dL (ref 0.0–1.2)
Total Protein: 6.5 g/dL (ref 6.5–8.1)

## 2024-11-12 LAB — BASIC METABOLIC PANEL WITH GFR
Anion gap: 11 (ref 5–15)
BUN: 5 mg/dL — ABNORMAL LOW (ref 6–20)
CO2: 25 mmol/L (ref 22–32)
Calcium: 8.4 mg/dL — ABNORMAL LOW (ref 8.9–10.3)
Chloride: 100 mmol/L (ref 98–111)
Creatinine, Ser: 0.71 mg/dL (ref 0.61–1.24)
GFR, Estimated: >60 mL/min (ref >60)
Glucose, Bld: 424 mg/dL — ABNORMAL HIGH (ref 70–99)
Potassium: 3.7 mmol/L (ref 3.5–5.1)
Sodium: 136 mmol/L (ref 135–145)

## 2024-11-12 LAB — RAPID URINE DRUG SCREEN, HOSP PERFORMED
Amphetamines: NOT DETECTED
Barbiturates: NOT DETECTED
Benzodiazepines: POSITIVE — AB
Cocaine: NOT DETECTED
Opiates: NOT DETECTED
Tetrahydrocannabinol: NOT DETECTED

## 2024-11-12 LAB — PROTIME-INR
INR: 1.3 — ABNORMAL HIGH (ref 0.8–1.2)
Prothrombin Time: 16.5 s — ABNORMAL HIGH (ref 11.4–15.2)

## 2024-11-12 LAB — URINALYSIS, ROUTINE W REFLEX MICROSCOPIC
Bacteria, UA: NONE SEEN
Bilirubin Urine: NEGATIVE
Glucose, UA: >=500 mg/dL — AB
Hgb urine dipstick: NEGATIVE
Ketones, ur: NEGATIVE mg/dL
Leukocytes,Ua: NEGATIVE
Nitrite: NEGATIVE
Protein, ur: NEGATIVE mg/dL
Specific Gravity, Urine: 1.03 (ref 1.005–1.030)
pH: 5 (ref 5.0–8.0)

## 2024-11-12 LAB — TROPONIN I (HIGH SENSITIVITY)
Troponin I (High Sensitivity): 4 ng/L (ref <18)
Troponin I (High Sensitivity): 5 ng/L (ref <18)

## 2024-11-12 LAB — ETHANOL: Alcohol, Ethyl (B): <15 mg/dL (ref <15)

## 2024-11-12 LAB — AMMONIA: Ammonia: 19 umol/L (ref 9–35)

## 2024-11-12 LAB — CBG MONITORING, ED
Glucose-Capillary: 422 mg/dL — ABNORMAL HIGH (ref 70–99)
Glucose-Capillary: 304 mg/dL — ABNORMAL HIGH (ref 70–99)

## 2024-11-12 MED ORDER — RIFAXIMIN 550 MG PO TABS
550.0000 mg | ORAL_TABLET | Freq: Once | ORAL | Status: AC
  Administered 2024-11-12: 550 mg via ORAL
  Filled 2024-11-12: qty 1

## 2024-11-12 MED ORDER — IOHEXOL 350 MG/ML SOLN
100.0000 mL | Freq: Once | INTRAVENOUS | Status: AC | PRN
  Administered 2024-11-12: 100 mL via INTRAVENOUS

## 2024-11-12 MED ORDER — CARVEDILOL 3.125 MG PO TABS
6.2500 mg | ORAL_TABLET | Freq: Once | ORAL | Status: AC
  Administered 2024-11-12: 6.25 mg via ORAL
  Filled 2024-11-12: qty 2

## 2024-11-12 MED ORDER — INSULIN ASPART 100 UNIT/ML IJ SOLN
8.0000 [IU] | Freq: Once | INTRAMUSCULAR | Status: AC
  Administered 2024-11-12: 8 [IU] via SUBCUTANEOUS
  Filled 2024-11-12: qty 8

## 2024-11-12 NOTE — ED Notes (Signed)
 ED Provider at bedside.

## 2024-11-12 NOTE — ED Triage Notes (Signed)
 Pt has hepatic encephalopathy and thinks that his symptoms are worsening. Pt BIB [facility] for increased brain fog and confusion, among other generalized complaints. Pt's primary care team is at [facility].
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
## 2024-11-12 NOTE — ED Provider Notes (Signed)
 I took over care of this patient at 7 AM pending remainder of workup. On evaluation he is feeling improved. Vitals are stable.  Physical Exam  BP 138/83   Pulse 76   Temp 98.1 F (36.7 C) (Oral)   Resp 19   SpO2 100%   Physical Exam Vitals and nursing note reviewed.  Constitutional:      General: He is not in acute distress.    Appearance: He is well-developed.  HENT:     Head: Normocephalic and atraumatic.  Eyes:     Conjunctiva/sclera: Conjunctivae normal.  Cardiovascular:     Rate and Rhythm: Normal rate and regular rhythm.     Heart sounds: No murmur heard. Pulmonary:     Effort: Pulmonary effort is normal. No respiratory distress.     Breath sounds: Normal breath sounds.  Abdominal:     Palpations: Abdomen is soft.     Tenderness: There is no abdominal tenderness.  Musculoskeletal:        General: No swelling.     Cervical back: Neck supple.  Skin:    General: Skin is warm and dry.     Capillary Refill: Capillary refill takes less than 2 seconds.  Neurological:     Mental Status: He is alert.  Psychiatric:        Mood and Affect: Mood normal.     Procedures  Procedures  ED Course / MDM    Medical Decision Making Patient signed out to me pending 2nd troponin. Remainder of workup negative with negative imaging results. Vitals are stable. Troponins are negative. He feels comfortable being discharged. Advised close follow up with primary care and to stay on his medications as prescribed. Advised to return to the ER for any new or worsening symptoms.   Problems Addressed: Cirrhosis of liver without ascites, unspecified hepatic cirrhosis type [facility]): chronic illness or injury  Amount and/or Complexity of Data Reviewed Labs: ordered. Radiology: ordered.  Risk OTC drugs. Prescription drug management.          Gennaro Duwaine CROME, DO 11/12/24 [phone]

## 2024-11-12 NOTE — ED Notes (Signed)
 Patient transported to CT

## 2024-11-12 NOTE — ED Notes (Signed)
 Paper work given and reviewed with pt. Pt is leaving for lobby in no new onset distress. Pt offered cab voucher but pt wanted to instead call [employer] back to where they are staying.

## 2024-11-12 NOTE — ED Notes (Signed)
Pt unable to obtain urine sample at this time 

## 2024-11-12 NOTE — Discharge Instructions (Addendum)
 Stay on your medications as prescribed. Call and follow up with your primary care doctor in 1-2 weeks.
